# Patient Record
Sex: Male | Born: 1960 | Race: White | Hispanic: No | Marital: Married | Smoking: Former smoker
Health system: Western US, Academic
[De-identification: ages and names within clinical notes are randomized; demographics above are authoritative.]

## PROBLEM LIST (undated history)

## (undated) DIAGNOSIS — I10 Essential (primary) hypertension: Secondary | ICD-10-CM

## (undated) DIAGNOSIS — IMO0002 Reserved for concepts with insufficient information to code with codable children: Secondary | ICD-10-CM

## (undated) DIAGNOSIS — E785 Hyperlipidemia, unspecified: Secondary | ICD-10-CM

## (undated) DIAGNOSIS — K219 Gastro-esophageal reflux disease without esophagitis: Secondary | ICD-10-CM

## (undated) DIAGNOSIS — L570 Actinic keratosis: Secondary | ICD-10-CM

## (undated) DIAGNOSIS — R229 Localized swelling, mass and lump, unspecified: Secondary | ICD-10-CM

## (undated) DIAGNOSIS — M5432 Sciatica, left side: Secondary | ICD-10-CM

## (undated) HISTORY — PX: OTHER PROCEDURE: U1053

## (undated) HISTORY — DX: Reserved for concepts with insufficient information to code with codable children: IMO0002

## (undated) HISTORY — DX: Localized swelling, mass and lump, unspecified: R22.9

## (undated) HISTORY — DX: Gastro-esophageal reflux disease without esophagitis: K21.9

## (undated) HISTORY — DX: Sciatica, left side: M54.32

## (undated) HISTORY — DX: Essential (primary) hypertension: I10

## (undated) HISTORY — DX: Hyperlipidemia, unspecified: E78.5

## (undated) HISTORY — DX: Actinic keratosis: L57.0

## (undated) MED ORDER — LISINOPRIL 20 MG OR TABS
20.00 mg | ORAL_TABLET | Freq: Every day | ORAL | Status: AC
Start: 2010-11-06 — End: 2013-11-05

---

## 2006-07-03 ENCOUNTER — Ambulatory Visit (INDEPENDENT_AMBULATORY_CARE_PROVIDER_SITE_OTHER): Admitting: Family Medicine

## 2006-07-03 ENCOUNTER — Telehealth (INDEPENDENT_AMBULATORY_CARE_PROVIDER_SITE_OTHER): Payer: Self-pay | Admitting: Family Practice

## 2006-07-03 VITALS — BP 150/104 | HR 70 | Temp 97.2°F | Resp 19 | Wt 189.0 lb

## 2006-07-03 MED ORDER — KEFLEX 500 MG OR CAPS
ORAL_CAPSULE | ORAL | Status: AC
Start: 2006-07-03 — End: 2006-07-10

## 2006-07-03 MED ORDER — TRIAMCINOLONE ACETONIDE 0.1 % EX CREA
TOPICAL_CREAM | CUTANEOUS | Status: DC
Start: 2006-07-03 — End: 2006-09-30

## 2006-07-03 NOTE — Telephone Encounter (Signed)
PT SCHEDULED TODAY

## 2006-07-03 NOTE — Patient Instructions (Signed)
Skin or Soft Tissue Abscess  What is a skin or soft tissue abscess?   An abscess is a pocket of infected fluid with a thick wall around it. Skin or soft tissue abscesses occur when bacteria get into tissue below the outer layer of skin. Often abscesses occur when hair follicles or sweat glands get infected or after minor scrapes or puncture wounds.   How does it occur?   Most of the time an abscess forms when bacteria enter a break in the skin. For example, an abscess might develop when a hair becomes ingrown or when the skin is scratched or poked with something sharp. As more bacteria grow (multiply) in the skin or soft tissues, the body responds by forming a wall around the area to keep the bacteria from spreading. The bacteria in this pocket continue to multiply and as they do, the pocket becomes more swollen. Some of the bacteria may get through the wall and cause an infection of the tissues around the abscess or even get into the blood and infect other parts of the body.   What are the symptoms?   The symptoms of an abscess in the skin or soft tissues are:    swelling   redness   pain.   If the skin around the abscess has become infected, the redness may spread toward the center of the body and you may have a fever, body aches, and tiredness.   How is it diagnosed?   Your healthcare provider will ask about your symptoms and examine the area that is red and swollen.   How can I take care of myself?    Warm compresses to area for 15 minutes four times a day  Keflex 500 mg four times a day  If the redness increases, call the office.   Take Tylenol or motrin for pain   How can I help prevent a skin or soft tissue abscess?    Clean your skin as soon as possible after you are scratched or poked.   If you think you may be developing an abscess, see your healthcare provider as soon as possible.

## 2006-07-03 NOTE — Telephone Encounter (Addendum)
Npt c/o a red lump on his neck that appears to be inflamed x 3 days.

## 2006-07-06 ENCOUNTER — Telehealth (INDEPENDENT_AMBULATORY_CARE_PROVIDER_SITE_OTHER): Payer: Self-pay | Admitting: Family Medicine

## 2006-07-06 NOTE — Progress Notes (Signed)
SUBJECTIVE:  Derrick Werner is a 46 year old male with chief complaint  1.red bump on back of neck x 4 days  States has a small pea sized "knot" on back of neck for months.  Over the past few days, "knot" has turned red, grown to size of walnut., became tender and itchy.  Denies exposure to new soap,detergent, pets, lotions, travel, plants, medications  Review of Systems -   Constitutional: negative for: fatigue, night sweats, weight loss, malaise, fever.  Ears, Nose, Mouth, Throat: negative for:  sore throat, hoarseness, nasal congestion, rhinorrhea, ear pain.  CV: negative for:  palpitations, syncope, chest pain.  Resp: negative for:  cough, sputum, shortness of breath, wheezing.  Integumentary: itching, lumps or bumps.  OBJECTIVE:  Blood pressure 150/104, pulse 70, temperature 97.2 F (36.2 C), temperature source Oral, resp. rate 19, weight 189 lb (85.73 kg).  MVH:QION developed and well nourished and non-toxic  HEENT:no injection or icterus  Neck: supple and posterior cervical nodes.  Raised, red, tender, inderated,warm walnut sized mass on posterior neck  Neuro: mental status, speech normal, alert and oriented x iii  ASSESSMENT/PLAN:  1. Posterior neck abcess vs inflammed sebacecous cyst  As lesion is not fluctuant, would not I/D at this time.  NSAIDs/Tylenol for pain.  TAC cream for itching- See orders   Will treat with keflex x 14 days. If becomes fluctant and increased in size , return for I/D with probable packing  Advised ED/return if increase size, redness expanding, fever, increased pain, concerns  Advised once inflammation resolves, can follow up to have "knot" removed  Above plan was discussed with pt/wife-all questions answered and agree with above.

## 2006-07-07 ENCOUNTER — Telehealth (INDEPENDENT_AMBULATORY_CARE_PROVIDER_SITE_OTHER): Payer: Self-pay | Admitting: Family Medicine

## 2006-07-07 NOTE — Telephone Encounter (Signed)
L/M FOR PT INFORMING WHAT TO DO AFTER TAKING COURSE OF ANTIBIOTICS.

## 2006-07-08 ENCOUNTER — Encounter (INDEPENDENT_AMBULATORY_CARE_PROVIDER_SITE_OTHER)

## 2006-07-08 ENCOUNTER — Ambulatory Visit (INDEPENDENT_AMBULATORY_CARE_PROVIDER_SITE_OTHER): Admitting: Family Medicine

## 2006-07-08 NOTE — Progress Notes (Addendum)
SUBJECTIVE:  Derrick Werner is a 46 year old male with chief complaint   1.wound check  States taking antibiotics and using warm compresses.  States having increased pain. States feels mass is harder. Denies increased size, redness.  OBJECTIVE:  DGL:OVFI developed and well nourished and non-toxic  Neck: raised, erythematous,  with multiple surface pustules walnut sized mass.  Able to express yellow/white drainage with light pressure  ASSESSMENT/PLAN:  1.neck abcess- stable  Complete antibiotics.  Continue warm compresses, Tylenol and NSAIDs.  Return if increased redness, fever, pain, concerns  Plan for re-eval for possible cyst removal in 2-3 weeks-must be completely healed

## 2006-07-29 ENCOUNTER — Ambulatory Visit (INDEPENDENT_AMBULATORY_CARE_PROVIDER_SITE_OTHER): Admitting: Family Medicine

## 2006-07-29 VITALS — BP 125/80 | HR 61 | Temp 97.5°F | Wt 186.9 lb

## 2006-07-29 NOTE — Progress Notes (Signed)
SUBJECTIVE:  This is a 46 year old male presents for lesion removal. This lesion has been   present for for several months.  It is recurrent.  Just finished antibiotics for acute flare up..    OBJECTIVE:  1 cm indurated inflamed esion in the lateral posterior neck    ASSESSMENT:  Recurrent epidermal inclusion cyst    PLAN:  After informed consent was obtained, using Betadine for cleansing and 1% Lidocaine with epinephrine for anesthetic, with sterile technique elliptical excision in total was performed.  The wound is   sutured with 5 Ethilon 3/0 mattress sutures aftter clsoure of the dead space with 4/0 Vicryl.  Antibiotic dressing is applied, and wound care instructions provided.  Be alert for any signs of cutaneous infection.  The specimen is labelled and sent to pathology for evaluation.  The procedure was well tolerated without complications.

## 2006-08-12 ENCOUNTER — Ambulatory Visit (INDEPENDENT_AMBULATORY_CARE_PROVIDER_SITE_OTHER)

## 2006-08-12 NOTE — Progress Notes (Signed)
SUBJECTIVE:   Derrick Werner is a 46 year old male with chief complaint  1. Suture removal  States had cyst removed 2 weeks ago. Denies fever, pain, bleeding, drainage.  OBJECTIVE:  Neck: incision C/D/I  ASSESSMENT/PLAN:  1. status post epidermal cyst removal- healing well  Reviewd s/sx of infection  Return prn

## 2006-08-12 NOTE — Progress Notes (Signed)
Pt here for nurse visit: suture removal left neck.  Sutures evaluated by Dr. Normajean Glasgow, ok to remove.  5 sutures removed and steri-strips placed. Pt tolerated well. Pt given precautions to watch for infection.

## 2006-08-19 ENCOUNTER — Ambulatory Visit (INDEPENDENT_AMBULATORY_CARE_PROVIDER_SITE_OTHER): Admitting: Family Medicine

## 2006-08-19 VITALS — BP 132/70 | HR 72 | Temp 97.0°F | Wt 184.6 lb

## 2006-08-19 NOTE — Progress Notes (Signed)
SUBJECTIVE:   Derrick Werner is a 46 year old male who presents for cyst removal. We have already discussed this procedure at a recent visit, including option of not performing surgery, technique of surgery and potential for scarring. Informed consent obtained including risk of infection and bleeding.    OBJECTIVE:   Patient appears well. Vitals are normal.  Skin: 5mm rubbery subcutaneous cyst with central punctum at the lateral neck, inferoposterior to right ear    ASSESSMENT:   sebaceous cyst    PLAN:   After informed consent was obtained, using Betadine for cleansing and 2cc 1% Lidocaine with epinephrine for anesthetic, with sterile technique, elliptical excision in total was performed. Antibiotic dressing is applied, and wound care instructions provided.  Be alert for any signs of cutaneous infection. The procedure was well tolerated without complications. Follow up: return for suture removal in 10 days, the patient may return prn.    Dr. Judieth Keens present throughout procedure

## 2006-08-20 NOTE — Progress Notes (Signed)
Attending Note:    I was presence for the key & critical portions of the procedure and immediate availability throughout the procedure    Ludwig Lean, MD  Signature Derived From Controlled Access Password, August 20, 2006, 10:53 PM

## 2006-08-29 ENCOUNTER — Ambulatory Visit (INDEPENDENT_AMBULATORY_CARE_PROVIDER_SITE_OTHER)

## 2006-09-29 ENCOUNTER — Telehealth (INDEPENDENT_AMBULATORY_CARE_PROVIDER_SITE_OTHER): Payer: Self-pay | Admitting: Sports Medicine

## 2006-09-29 NOTE — Telephone Encounter (Signed)
APPT SCHED TOMORROW

## 2006-09-29 NOTE — Telephone Encounter (Signed)
PT C/O RASH ON BOTH LEGS.

## 2006-09-30 ENCOUNTER — Ambulatory Visit (INDEPENDENT_AMBULATORY_CARE_PROVIDER_SITE_OTHER): Admitting: Family Medicine

## 2006-09-30 VITALS — BP 136/77 | HR 74 | Temp 98.1°F | Wt 186.0 lb

## 2006-09-30 MED ORDER — DICLOXACILLIN SODIUM 500 MG OR CAPS
ORAL_CAPSULE | ORAL | Status: AC
Start: 2006-09-30 — End: 2006-10-07

## 2006-09-30 MED ORDER — ADACEL 5-2-15.5 IM SUSP
INTRAMUSCULAR | Status: AC
Start: 2006-09-30 — End: 2006-10-01

## 2006-12-20 NOTE — Progress Notes (Signed)
Progress note closed administratively without a charge as it has been greater than six months since the encounter was opened.  The note has not been reviewed or signed by the visit provider.  -Courtney Paris, MD (Physician Analyst, [place] Epic team)    This encounter was opened in error.  Please disregard.

## 2008-09-20 ENCOUNTER — Ambulatory Visit (INDEPENDENT_AMBULATORY_CARE_PROVIDER_SITE_OTHER): Admitting: Family Practice

## 2008-09-20 ENCOUNTER — Encounter (INDEPENDENT_AMBULATORY_CARE_PROVIDER_SITE_OTHER): Payer: Self-pay | Admitting: Family Practice

## 2008-09-20 ENCOUNTER — Telehealth (INDEPENDENT_AMBULATORY_CARE_PROVIDER_SITE_OTHER): Payer: Self-pay | Admitting: Family Medicine

## 2008-09-20 MED ORDER — AZITHROMYCIN 250 MG OR TABS
ORAL_TABLET | ORAL | Status: DC
Start: 2008-09-20 — End: 2008-09-20

## 2008-09-20 MED ORDER — AZITHROMYCIN 250 MG OR TABS
ORAL_TABLET | ORAL | Status: DC
Start: 2008-09-20 — End: 2009-05-08

## 2008-09-20 NOTE — Patient Instructions (Signed)
 Symptomatic therapy suggested: gargle for sore throat, use mist at bedside for congestion. Apply facial warm packs for sinus pain. May use acetaminophen, antihistamine-decongestant of choice prn.

## 2008-09-20 NOTE — Telephone Encounter (Signed)
 APPT SCHED TODAY

## 2008-09-20 NOTE — Progress Notes (Signed)
 Chief Complaint   Patient presents with   . Sinus Problem         Subjective: Lamine Laton is a 48 year old male  who presents for a new problem of nasal congestion, purulent rhinorrhea, headache and post nasal drip.    She states that since onset 8 day(s) ago, the symptoms are gradually worsening.  Associated symptoms include no other symptoms.  he has had similar symptoms in the past.states he also has loose bm in the last few days, 4-5 bm/d no blood or mucus, has only minimal abd pain ,no n/v, denies camping/travel recently.    Patient reports using no medication but for excedrine.     Respiratory history: no history of pneumonia, bronchitis, asthma, or COPD.     ROS:    Constitutional: negative for: fatigue, night sweats, weight loss, anorexia, fever.  Resp: negative for:  shortness of breath, pleuritic pain, dyspnea on exertion.      History:  I did review patient's past medical and family/social history.    Objective:    BP 135/75  Pulse 65  Temp(Src) 97.9 F (36.6 C) (Oral)  Wt 189 lb (85.73 kg)    General Appearance: healthy, alert, no distress, pleasant affect, cooperative.no dehydration.  Ears:  normal TMs and canal.  Nose:  mucosa swollen, and erythematous, tender to palpation on maxillary sinuses.  Mouth: moderate erythema.  Heart:  normal rate and regular rhythm, no murmurs, clicks, or gallops.  Lungs: clear to auscultation and percussion, no chest deformities noted.  Abd: no ttp no hsm, no rebound.    [place] was seen today for sinus problem.    Diagnoses and associated orders for this visit:    Glenford Peers (upper respiratory infection)  -sx treatment, rest, fluids, tylenol, otc decongestants and f/u    Diarrhea  - as above.    Sinusitis acute  - azithromycin (ZITHROMAX) tablet; Take  by mouth As Directed. Take 2 tablets today and the one tab a day.  -antibiotic to be used if symptoms has not improved as anticipated(prescription provided)   - Patient to call back for any new or worsening  symptoms.  -pt advised not to use asa until recovery.    Patient Instruction:  See Patient Education and Orders Sections.      Barriers to Learning assessed: none.   Patient verbalizes understanding of teaching and instructions.

## 2008-09-20 NOTE — Telephone Encounter (Signed)
 PT C/O COUGH,  GREEN PHLEGM, HEAD AND CHEST CONGESTION, SORE THROAT AND DIERRAH  X 8

## 2008-09-22 ENCOUNTER — Telehealth (INDEPENDENT_AMBULATORY_CARE_PROVIDER_SITE_OTHER): Payer: Self-pay | Admitting: Family Practice

## 2008-09-22 NOTE — Telephone Encounter (Signed)
 Pt with c/o headache and bad congestion in the nose, states he was told not to take the excedrin, instead he is using tylenol which does not help, The patient denies any symptoms of neurological impairment or TIAs; no amaurosis, diplopia, dysphasia, or unilateral disturbance of motor or sensory function. No loss of balance or vertigo. And no n/v.  Pt advised may use otc decongestants, afrin( only for 3 days) , may alternate tylenol/ibuprofen,  Patient to call back for any new or worsening symptoms.other causes of headache d/w pt.ER precaution given.  The patient indicates understanding of these issues and agrees to the plan.

## 2008-09-22 NOTE — Telephone Encounter (Signed)
 PT C/O SEVERE HA X 2 DAYS, REQ TO KNOW WHAT HE CAN TAKE ALONG WITH ANTIBIOTICS  CALL TRANSFERRED TO DR Wendall Papa

## 2009-03-07 ENCOUNTER — Ambulatory Visit (INDEPENDENT_AMBULATORY_CARE_PROVIDER_SITE_OTHER): Payer: Self-pay | Admitting: Family Practice

## 2009-03-08 ENCOUNTER — Ambulatory Visit (INDEPENDENT_AMBULATORY_CARE_PROVIDER_SITE_OTHER): Admitting: Family Practice

## 2009-03-09 ENCOUNTER — Telehealth (INDEPENDENT_AMBULATORY_CARE_PROVIDER_SITE_OTHER): Payer: Self-pay | Admitting: Family Medicine

## 2009-03-14 ENCOUNTER — Ambulatory Visit (INDEPENDENT_AMBULATORY_CARE_PROVIDER_SITE_OTHER): Payer: Self-pay | Admitting: Family Practice

## 2009-03-17 ENCOUNTER — Ambulatory Visit (INDEPENDENT_AMBULATORY_CARE_PROVIDER_SITE_OTHER): Admitting: Neuromusculoskeletal Medicine & OMM

## 2009-03-17 ENCOUNTER — Encounter (INDEPENDENT_AMBULATORY_CARE_PROVIDER_SITE_OTHER): Payer: Self-pay | Admitting: Neuromusculoskeletal Medicine & OMM

## 2009-03-17 LAB — TSH, BLOOD: TSH: 2.26 u[IU]/mL (ref 0.27–4.20)

## 2009-03-17 LAB — COMPREHENSIVE METABOLIC PANEL, BLOOD
ALT (SGPT): 21 U/L
AST (SGOT): 19 U/L
Albumin: 4.4 g/dL (ref 3.5–5.2)
Alkaline Phos: 75 U/L
BUN: 16 mg/dL (ref 6–20)
Bicarbonate: 26 mmol/L (ref 22–29)
Bilirubin, Tot: 0.3 mg/dL (ref <1.2)
Calcium: 9.7 mg/dL (ref 8.6–10.5)
Chloride: 103 mmol (ref 98–107)
Creatinine: 0.92 mg/dL (ref 0.67–1.17)
GFR (African Amer.): >60 mL/min
GFR: >60 mL/min
Glucose: 115 mg/dL (ref ?–115)
Potassium: 3.8 mmol/L (ref 3.5–5.1)
Sodium: 142 mmol/L (ref 136–145)
Total Protein: 6.8 g/dL (ref ?–8.0)

## 2009-03-17 LAB — STOOL OCCULT BLOOD POCT
Occult Blood, Control: POSITIVE
Occult Blood, Guaiac: NEGATIVE

## 2009-03-17 LAB — LIPID(CHOL FRACT) PANEL, BLOOD
Cholesterol: 216 mg/dL — ABNORMAL HIGH (ref <200)
HDL-Cholesterol: 32 mg/dL — ABNORMAL LOW
LDL-Chol (Calc): INVALID mg/dL (ref <160)
Triglycerides: 537 mg/dL — ABNORMAL HIGH (ref 10–170)

## 2009-03-17 LAB — PSA (SCREEN), BLOOD: PSA: 2.44 ng/mL (ref <4.00)

## 2009-03-17 MED ORDER — HYDROCHLOROTHIAZIDE 25 MG OR TABS
25.0000 mg | ORAL_TABLET | Freq: Every day | ORAL | Status: DC
Start: 2009-03-17 — End: 2009-04-12

## 2009-03-17 MED ORDER — IBUPROFEN 200 MG OR TABS
200.00 mg | ORAL_TABLET | Freq: Four times a day (QID) | ORAL | Status: DC | PRN
Start: ? — End: 2011-02-04

## 2009-03-17 MED ORDER — ASPIRIN-ACETAMINOPHEN-CAFFEINE 250-250-65 MG OR TABS
1.00 | ORAL_TABLET | Freq: Four times a day (QID) | ORAL | Status: DC | PRN
Start: ? — End: 2011-12-17

## 2009-03-17 MED ORDER — FLUTICASONE PROPIONATE 50 MCG/ACT NA SUSP
2.0000 | Freq: Every day | NASAL | Status: DC
Start: 2009-03-17 — End: 2011-02-04

## 2009-03-19 ENCOUNTER — Telehealth (INDEPENDENT_AMBULATORY_CARE_PROVIDER_SITE_OTHER): Payer: Self-pay | Admitting: Neuromusculoskeletal Medicine & OMM

## 2009-03-19 DIAGNOSIS — E785 Hyperlipidemia, unspecified: Secondary | ICD-10-CM

## 2009-03-19 DIAGNOSIS — I10 Essential (primary) hypertension: Secondary | ICD-10-CM

## 2009-03-31 ENCOUNTER — Encounter (INDEPENDENT_AMBULATORY_CARE_PROVIDER_SITE_OTHER)

## 2009-04-05 ENCOUNTER — Telehealth (INDEPENDENT_AMBULATORY_CARE_PROVIDER_SITE_OTHER): Payer: Self-pay | Admitting: Neuromusculoskeletal Medicine & OMM

## 2009-04-10 ENCOUNTER — Other Ambulatory Visit (INDEPENDENT_AMBULATORY_CARE_PROVIDER_SITE_OTHER)

## 2009-04-10 ENCOUNTER — Other Ambulatory Visit (INDEPENDENT_AMBULATORY_CARE_PROVIDER_SITE_OTHER): Payer: Self-pay | Admitting: Family Medicine

## 2009-04-10 ENCOUNTER — Encounter (INDEPENDENT_AMBULATORY_CARE_PROVIDER_SITE_OTHER): Payer: Self-pay

## 2009-04-10 LAB — BASIC METABOLIC PANEL, BLOOD
BUN: 16 mg/dL (ref 6–20)
Bicarbonate: 28 mmol/L (ref 22–29)
Calcium: 10.3 mg/dL (ref 8.6–10.5)
Chloride: 100 mmol/L (ref 98–107)
Creatinine: 0.96 mg/dL (ref 0.67–1.17)
Glucose: 78 mg/dL (ref 70–115)
Potassium: 4.7 mmol/L (ref 3.5–5.1)
Sodium: 140 mmol/L (ref 136–145)

## 2009-04-10 LAB — LIPID(CHOL FRACT) PANEL, BLOOD
Cholesterol: 289 mg/dL — ABNORMAL HIGH (ref <200)
HDL-Cholesterol: 43 mg/dL
LDL-Chol (Calc): 174 mg/dL — ABNORMAL HIGH (ref <160)
Triglycerides: 360 mg/dL — ABNORMAL HIGH (ref 10–170)

## 2009-04-10 LAB — GFR: GFR: >60 mL/min

## 2009-04-11 ENCOUNTER — Telehealth (INDEPENDENT_AMBULATORY_CARE_PROVIDER_SITE_OTHER): Payer: Self-pay | Admitting: Neuromusculoskeletal Medicine & OMM

## 2009-04-12 ENCOUNTER — Telehealth (INDEPENDENT_AMBULATORY_CARE_PROVIDER_SITE_OTHER): Payer: Self-pay | Admitting: Neuromusculoskeletal Medicine & OMM

## 2009-04-12 DIAGNOSIS — I10 Essential (primary) hypertension: Secondary | ICD-10-CM

## 2009-04-12 MED ORDER — HYDROCHLOROTHIAZIDE 25 MG OR TABS
25.0000 mg | ORAL_TABLET | Freq: Every day | ORAL | Status: DC
Start: 2009-04-12 — End: 2009-10-23

## 2009-04-14 NOTE — Telephone Encounter (Signed)
Routed message to Dr. Meredeth Ide. Patient would like a refill of his medication HCTZ 25 mg. Please advise patient when this has been completed.

## 2009-04-21 ENCOUNTER — Encounter (INDEPENDENT_AMBULATORY_CARE_PROVIDER_SITE_OTHER): Admitting: Neuromusculoskeletal Medicine & OMM

## 2009-04-28 ENCOUNTER — Telehealth (INDEPENDENT_AMBULATORY_CARE_PROVIDER_SITE_OTHER): Payer: Self-pay | Admitting: Family Medicine

## 2009-04-28 ENCOUNTER — Encounter (INDEPENDENT_AMBULATORY_CARE_PROVIDER_SITE_OTHER): Admitting: Neuromusculoskeletal Medicine & OMM

## 2009-04-28 ENCOUNTER — Other Ambulatory Visit (INDEPENDENT_AMBULATORY_CARE_PROVIDER_SITE_OTHER)

## 2009-04-28 LAB — LIPID(CHOL FRACT) PANEL, BLOOD
Cholesterol: 219 mg/dL — ABNORMAL HIGH (ref <200)
HDL-Cholesterol: 33 mg/dL
LDL-Chol (Calc): 143 mg/dL (ref <160)
Triglycerides: 213 mg/dL — ABNORMAL HIGH (ref 10–170)

## 2009-04-28 LAB — GFR: GFR: >60 mL/min

## 2009-04-28 LAB — BASIC METABOLIC PANEL, BLOOD
BUN: 17 mg/dL (ref 6–20)
Bicarbonate: 27 mmol/L (ref 22–29)
Calcium: 9.7 mg/dL (ref 8.6–10.5)
Chloride: 103 mmol/L (ref 98–107)
Creatinine: 0.9 mg/dL (ref 0.67–1.17)
Glucose: 83 mg/dL (ref 70–115)
Potassium: 4.1 mmol/L (ref 3.5–5.1)
Sodium: 139 mmol/L (ref 136–145)

## 2009-04-29 ENCOUNTER — Encounter (INDEPENDENT_AMBULATORY_CARE_PROVIDER_SITE_OTHER): Payer: Self-pay | Admitting: Neuromusculoskeletal Medicine & OMM

## 2009-05-08 ENCOUNTER — Ambulatory Visit (INDEPENDENT_AMBULATORY_CARE_PROVIDER_SITE_OTHER): Admitting: Neuromusculoskeletal Medicine & OMM

## 2009-05-08 VITALS — BP 133/83 | HR 68 | Temp 98.0°F | Wt 192.0 lb

## 2009-05-08 MED ORDER — FISH OIL OIL
5.00 mL | TOPICAL_OIL | Freq: Every day | Status: DC
Start: 2009-05-08 — End: 2011-02-04

## 2009-10-23 ENCOUNTER — Ambulatory Visit (INDEPENDENT_AMBULATORY_CARE_PROVIDER_SITE_OTHER): Admitting: Family Medicine

## 2009-10-23 VITALS — BP 127/87 | HR 80 | Temp 98.0°F | Resp 14 | Ht 70.0 in | Wt 187.0 lb

## 2009-10-23 MED ORDER — HYDROCHLOROTHIAZIDE 25 MG OR TABS
25.0000 mg | ORAL_TABLET | Freq: Every day | ORAL | Status: DC
Start: 2009-10-23 — End: 2010-10-31

## 2009-10-23 MED ORDER — FLUOROURACIL 5 % EX CREA
TOPICAL_CREAM | Freq: Every day | CUTANEOUS | Status: DC
Start: 2009-10-23 — End: 2009-10-23

## 2009-10-23 MED ORDER — HYDROCHLOROTHIAZIDE 25 MG OR TABS
25.0000 mg | ORAL_TABLET | Freq: Every day | ORAL | Status: DC
Start: 2009-10-23 — End: 2009-10-23

## 2009-10-23 MED ORDER — FLUOROURACIL 5 % EX CREA
TOPICAL_CREAM | Freq: Every day | CUTANEOUS | Status: DC
Start: 2009-10-23 — End: 2011-02-04

## 2009-10-24 ENCOUNTER — Encounter (INDEPENDENT_AMBULATORY_CARE_PROVIDER_SITE_OTHER): Payer: Self-pay | Admitting: Family Medicine

## 2009-10-24 NOTE — Progress Notes (Signed)
 CC:  Cyst around the neck    SUBJECTIVE:     This is a 49 year old male who had epidermal inclusion cyst removed from hjs neck about three years ago   Presents for evaluation of another one around the and possible  removal.                                                                            He also has multiple sun damages on the sun exposed areas of his skin   Noted hyperkeratotic lesions on arms and are scaling   these lesions have been progressing all over his skin       He also wanted refill onhis HTN medications.   Has no cardiovascular complaints today    Patient Active Problem List   Diagnoses Code   . Actinic keratosis 702.0   . Hypertension 401.9AH       Reviewed all his medications    Current outpatient prescriptions ordered prior to encounter   Medication Sig Dispense Refill   . hydrochlorothiazide (HYDRODIURIL) 25 MG tablet Take 1 tablet by mouth daily.  90 tablet  5   . fish oil Take 5 mL by mouth daily.    0   . aspirin-acetaminophen-caffeine (EXCEDRIN) 250-250-65 MG per tablet Take 1 Tab by mouth every 6 hours as needed.       Marland Kitchen ibuprofen (MOTRIN IB) 200 MG tablet Take 200 mg by mouth every 6 hours as needed.       . fluticasone propionate (FLONASE) 50 MCG/ACT nasal spray Use 2 Sprays into both nostrils daily. Daryel Gerald  1 Bottle  5       Review of Systems - Twelve systems reviewed are negative      OBJECTIVE:      Pleasant male in no acute distress    BP 127/87  Pulse 80  Temp(Src) 98 F (36.7 C) (Oral)  Resp 14  Ht 5\' 10"  (1.778 m)  Wt 187 lb (84.823 kg)  BMI 26.83 kg/m2    Skin:  Around the left postero-lateral aspect of the neck there is 1 cm indurated and inflamed     Lateral arms:  +  Actinic and sun damaged skin noted    ASSESSMENT/PLAN::  1. EIC (epidermal inclusion cyst) (706.2)     2. Actinic keratosis (702.0)  fluoroURACIL (EFUDEX) 5 % cream, fluoroURACIL (EFUDEX) 5 % cream   3. Hypertension (401.9)  hydrochlorothiazide (HYDRODIURIL) 25 MG tablet, hydrochlorothiazide  (HYDRODIURIL) 25 MG tablet     Schedule excision of the cysts  Refilled medications for HTN  Educated about use of Efudex mixed with sun screen twice weekly to treat and prevent AK.

## 2009-10-31 ENCOUNTER — Ambulatory Visit (INDEPENDENT_AMBULATORY_CARE_PROVIDER_SITE_OTHER): Admitting: Family Medicine

## 2009-10-31 VITALS — BP 136/89 | HR 69 | Temp 97.2°F | Resp 14 | Ht 70.0 in | Wt 190.0 lb

## 2009-10-31 MED ORDER — HYDROCODONE-ACETAMINOPHEN 5-500 MG OR TABS
1.0000 | ORAL_TABLET | Freq: Four times a day (QID) | ORAL | Status: DC | PRN
Start: 2009-10-31 — End: 2011-02-04

## 2009-11-01 NOTE — Progress Notes (Signed)
 CC:    Recurrent cyst in the neck    SUBJECTIVE:   49 year old male presents for removal recurrent epidermal inclusion cyst of the back of the neck.    These cysts have been excised in the same area in th past    But has recurred    Patient Active Problem List   Diagnoses Code   . Actinic keratosis 702.0   . Hypertension 401.[facility]       Current outpatient prescriptions ordered prior to encounter   Medication Sig Dispense Refill   . hydrochlorothiazide (HYDRODIURIL) 25 MG tablet Take 1 tablet by mouth daily.  90 tablet  5   . fluoroURACIL (EFUDEX) 5 % cream Apply  topically daily. Apply a thin layer as directed by mixing a pea size amount with sunscreen  1 Tube  3   . fish oil Take 5 mL by mouth daily.    0   . aspirin-acetaminophen-caffeine (EXCEDRIN) 250-250-65 MG per tablet Take 1 Tab by mouth every 6 hours as needed.       Marland Kitchen ibuprofen (MOTRIN IB) 200 MG tablet Take 200 mg by mouth every 6 hours as needed.       . fluticasone propionate (FLONASE) 50 MCG/ACT nasal spray Use 2 Sprays into both nostrils daily. Daryel Gerald  1 Bottle  5         OBJECTIVE:      Pleasant gentleman   BP 136/89  Pulse 69  Temp(Src) 97.2 F (36.2 C) (Oral)  Resp 14  Ht 5\' 10"  (1.778 m)  Wt 190 lb (86.183 kg)  BMI 27.26 kg/m2    Skin:  In the posterior aspect of the neck, there is 3 cm induration without erythema   Nontender   No purulence   No punctum    ASSESSMENT:  Recurrent epidermal inclusion cyst    PLAN:    Pt identified at time out  Correct procedure    After informed consent was obtained, using Betadine for cleansing   and 1% Lidocaine  with epinephrine for anesthetic, with sterile   technique elliptical excision in total was performed going through the subcutaneous fascia and fat.  The wound is   sutured with 3/0 interrupted subcutaneous Vicryl and four 4/0 Ethilon.  Antibiotic dressing is applied, and wound   care instructions provided.  Be alert for any signs of cutaneous   infection.  The specimen is labelled and sent to  pathology for   evaluation.  The procedure was well tolerated without complications.    Vicodin was prescribed for pain

## 2009-11-10 ENCOUNTER — Encounter (INDEPENDENT_AMBULATORY_CARE_PROVIDER_SITE_OTHER)

## 2010-02-22 NOTE — Lab (Signed)
Dermatopathologist: Clenton Pare. Maren Reamer, M.D.      Report Date: 08/03/2006        Dermatopathology Laboratory  Division of Dermatology  CLIA# [id number]  Director of Clinical Labs: Gwenyth Ober, M.D., Ph.D.    BIOPSY DATE: 07/29/2006    ACCESSION NUMBER: [id number].    FACILITY: [facility], [place].    REQUESTING PHYSICIAN: Cecilio Asper, M.D.    DATE RECEIVED: 07/30/2006    SITE/CLINICAL DIAGNOSIS: Lateral posterior neck/706.2 epidermal inclusion  cyst.    CLINICAL HISTORY: Recurrent epidermal inclusion cyst of the neck, excised  after treatment with antibiotics. Very fibrotic, with excessive induration  and inflammation, partially excised.    GROSS DESCRIPTION: The specimen consists of a portion of skin measuring  1.8 x 1.0 x 1.5 cm. The specimen was serially sectioned and entirely  submitted in 1 cassette.    MICROSCOPIC DESCRIPTION: The dermis shows evidence of marked fibrotic  change with nodular aggregates of fibrous tissue. Also noted are areas of  granulation tissue and small collections of granulomatous infiltrates. No  cyst was noted in any of the sections examined.    PATHOLOGIC DIAGNOSIS: Skin, lateral posterior neck: Dermal fibrosis with  granulation tissue and foreign body reaction consistent with ruptured  epidermoid cyst.            Job Number [id number] clo            Signature Derived From Controlled Access Password  Ladon Heney C. Maren Reamer, M.D. 08/04/2006 06:10 P          DD: 08/03/2006 DT: 08/04/2006 08:22 A DocNo.: [id number]  TCO/[id number] [id number]    cc: Buzzy Han, M.D.   [employer]   [place] [address] [place] [address]

## 2010-02-22 NOTE — Lab (Signed)
 FINAL PATHOLOGIC DIAGNOSIS:  A: Skin and subcutaneous tissue, posterior neck, excision   -Epidermal inclusion cyst and scar.  SPECIMEN(S) SUBMITTED: A: subcutaneous posterior neck  CLINICAL HISTORY: ICD-9 code: 706.2. Re-excision of epidermal inclusion  cyst with scarring.  GROSS DESCRIPTION: A: The specimen (received in formalin, labeled with the  patient's name and medical record number only) consists of two pink-tan  fragments of soft tissue ranging from 1.5 x 1.5 x 1.0 cm to 2.0 x 1.0 x 1.0  cm. A representative section from each fragment is submitted in cassette  A1.  ES/PM/jb  CONFIDENTIAL HEALTH INFORMATION: Health Care information is personal and  sensitive information. If it is being faxed to you it is done so under  appropriate authorization from the patient or under circumstances that do  not require patient authorization. You, the recipient, are obligated to  maintain it in a safe, secure and confidential manner. Re-disclosure  without additional patient consent or as permitted by law is prohibited.  Unauthorized re-disclosure or failure to maintain confidentiality could  subject you to penalties described in federal and state law. If you have  received this report or facsimile in error, please notify the [facility] Department immediately and destroy the received document(s).   Material reviewed and Interpreted and  Report Electronically Signed by:  Kingsley Spittle M.D. [phone])  Attending Surgical Pathologist  11/02/09 13:23  Electronic Signature derived from a single  controlled access password

## 2010-08-31 ENCOUNTER — Ambulatory Visit (INDEPENDENT_AMBULATORY_CARE_PROVIDER_SITE_OTHER): Admitting: Family Medicine

## 2010-08-31 VITALS — BP 141/81 | HR 91 | Temp 97.3°F | Wt 191.0 lb

## 2010-08-31 MED ORDER — HYDROCODONE-ACETAMINOPHEN 5-500 MG OR TABS
1.0000 | ORAL_TABLET | Freq: Four times a day (QID) | ORAL | Status: DC | PRN
Start: 2010-08-31 — End: 2011-02-04

## 2010-08-31 NOTE — Progress Notes (Signed)
FP Attending Note    Chief Complaint   Patient presents with   . ER F/U       History of Present Illness:  Derrick Werner is a 50 year old male presenting for ED follow up for nosebleed.    Went to [facility] ED 48 hrs ago.  Nasal tampon placed.    Reports some bleeding since having this placed.  Bleed was precipitated by a cold.  Patient also not taking bp medicine regularly.    Back for the past 6 months.  Has been taking ibuprofen 600-800 tid on and off for several months.  Pain is in low back.  Radiates down both legs.  Is occ debilitating.  Denies any weakness or numbness.      htn - Since restarting BP medication, reporting numbers 130-140 sbp range.  Denies any medication problems or side-effects.    ROS:  Constitutional: negative.  Ears, Nose, Mouth, Throat: as in HPI.  CV: negative.  Resp: negative.    Past Medical History   Diagnosis Date   . Hypertension 10/24/2009   . Actinic keratosis 10/23/2009      No family history on file.   History   Substance Use Topics   . Smoking status: Never Smoker    . Smokeless tobacco: Never Used   . Alcohol Use: Not on file      Current Outpatient Prescriptions   Medication Sig   . HYDROcodone-acetaminophen (VICODIN) 5-500 MG per tablet Take 1 tablet by mouth every 6 hours as needed for Moderate Pain (Pain Score 4-6).   . hydrochlorothiazide (HYDRODIURIL) 25 MG tablet Take 1 tablet by mouth daily.   . fluoroURACIL (EFUDEX) 5 % cream Apply  topically daily. Apply a thin layer as directed by mixing a pea size amount with sunscreen   . fish oil Take 5 mL by mouth daily.   Marland Kitchen aspirin-acetaminophen-caffeine (EXCEDRIN) 250-250-65 MG per tablet Take 1 Tab by mouth every 6 hours as needed.   Marland Kitchen ibuprofen (MOTRIN IB) 200 MG tablet Take 200 mg by mouth every 6 hours as needed.   . fluticasone propionate (FLONASE) 50 MCG/ACT nasal spray Use 2 Sprays into both nostrils daily. /nostril      Allergies as of 08/31/2010   . (No Known Allergies)      OBJECTIVE:   BP 141/81  Pulse 91   Temp(Src) 97.3 F (36.3 C) (Oral)  Wt 86.637 kg (191 lb)   Physical Exam: General Appearance: healthy, alert, no distress, pleasant affect, cooperative.  Eyes:  conjunctivae and corneas clear.  Ears:  normal TMs and canal.  Nose:  R side rhinorocked in place.  Mouth: oropharynx clear.  Neck:  Neck supple. No adenopathy, thyroid symmetric, normal size.  Heart:  normal rate and regular rhythm, no murmurs, clicks, or gallops.  Lungs: clear to auscultation and percussion, no chest deformities noted.  Musculoskeletal: back:Back symmetric, no curvature. ROM normal. No CVA tenderness.     ASSESSMENT and PLAN:  1. Back pain (724.5)  X-RAY LUMBAR SPINE, CONSULT/REFERRAL TO PHYSICAL THERAPY-OUTSIDE (NON-[place]), HYDROcodone-acetaminophen (VICODIN) 5-500 MG per tablet  Likely mechanical lbp.  No red flag signs/sx.  Given duration of symptoms, will obtain xrays as above.  Advised to d/c ibuprofen and other NSAID's.  Patient amenable to starting physical therapy.  Short course of vicodin prescribed for pain.   2. Epistaxis (784.7)  Nasal tampon removed in clinic today.  Patient observed x 30 min without rebleeding.  Advised on avoiding nsaids and nasal  irritants.  Will consider ENT referral if symptoms recur.     3. Hypertension (401.9)  BP with borderline control.  Will continue current regimen for now.  Recheck at next visit.    follow up for chronic conditions in 1 month    I did review patient's past medical and family/social history.  Barriers to Learning assessed: none. Patient verbalizes understanding of teaching and instructions.    Acquanetta Belling, M.D.  Department of Family and Preventive Medicine  Pager [phone]

## 2010-09-05 ENCOUNTER — Telehealth (INDEPENDENT_AMBULATORY_CARE_PROVIDER_SITE_OTHER): Payer: Self-pay | Admitting: Family Medicine

## 2010-09-05 NOTE — Telephone Encounter (Signed)
Spoke w/ pt states has been experiencing excessive nose bleeds was seen in [place] ed 08/29/10 had rhino rocket placed which was then removed 08/31/10. Pt states still having persistent nose bleeds was told to stop taking any med's that would inc sx and to stay away from physical activities until this Monday. Pt states still experiencing nose bleeds 4-5 times a day he is able to control the bleeding within 10-15 min's but states it is making it impossible to do daily routines. Pt requesting urgent attention w/ next plan of care requesting referral to ENT. Pt w/ ppo no referral needed pt given number to [facility] to set up appt. Pt also requesting referral to be placed in system. Please notify once referral placed           Pt was informed if unable to control nose bleeds within 30 min's needs to go to ed.

## 2010-09-05 NOTE — Telephone Encounter (Signed)
Agree with urgent ENT follow up.  STAT referral placed.    Agree with ED eval for persistent bleed > 30 min.

## 2010-09-05 NOTE — Telephone Encounter (Signed)
Pt's wife calling regarding a rhino rocket that was removed Friday. Since then his nose has bled half a dozen times, and 3 times in the last 24 hours. She is requesting a call back from the nurse as soon as possible to advise.

## 2010-09-05 NOTE — Telephone Encounter (Signed)
Spoke w/ pt informed referral placed pt already scheduled for 09/06/10 pt given ed precautions again for nose bleeds lasting greater than >30 min's    Pt also wanted to let Dr. Maureen Ralphs know bp ranging 140/100 is taking Hctz pt was informed should schedule f/u w/ pcp to discuss bp. Pt agreed will c/b for appt after ent appt

## 2010-09-06 ENCOUNTER — Ambulatory Visit (HOSPITAL_BASED_OUTPATIENT_CLINIC_OR_DEPARTMENT_OTHER)

## 2010-09-06 ENCOUNTER — Encounter (HOSPITAL_BASED_OUTPATIENT_CLINIC_OR_DEPARTMENT_OTHER): Payer: Self-pay

## 2010-09-06 VITALS — BP 135/85 | HR 66 | Temp 98.3°F | Resp 16

## 2010-09-06 MED ORDER — TIMOLOL MALEATE 0.5 % OP SOLN
OPHTHALMIC | Status: DC
Start: 2010-09-06 — End: 2011-02-04

## 2010-09-06 MED ORDER — LISINOPRIL 20 MG OR TABS
20.0000 mg | ORAL_TABLET | Freq: Every day | ORAL | Status: DC
Start: 2010-09-06 — End: 2011-02-04

## 2010-09-06 NOTE — Progress Notes (Signed)
I have taken the history, reviewed the PMH and ROS and performed the appropriate ENT examination. Over 50% of the time spent has been talking with the patient. The following is a dictation, performed in the presence of the patient summarizing my impression and plans.    Laural Golden, M.D.  Professor of Surgery  [employer]  [place], [place]  Email: [email or web address]  Website: [email or web address]    Practicing and Teaching Surgical Excellence While Advancing the Field of Surgery  Pt hypertensive tp 170 /110  Rx Lisonopril 20 mgs daily with Dr Yvonna Alanis OK

## 2010-09-06 NOTE — Progress Notes (Signed)
CLINIC: [facility] ENT      REPORT TYPE: NOTE      Dictating Practitioner: Helene Shoe. Ignacia Palma, M.D.    DATE OF SERVICE:  09/06/2010    REASON FOR VISIT: NOSE BLEED          Patient:  Derrick Werner, Derrick Werner  MR#: [id number]  DOS:  09/06/2010  DOB:  28-Jun-1960  Mr. Mcgue is a 50 year old gentleman who, for the last week, has suffered  right-sided nose bleeding. He at one time had a pack and even required  hospitalization. He describes the bleeding as coming first in the back of  his nose before it comes out the front. The problem is daily. The patient  is not on aspirin, although he was on Motrin. He does not take  anticoagulant medicine.    At endoscopy, there is an obvious sore or vascular lesion on the right  posterior middle turbinate. I am certain this is where the bleeding is  coming from. It is not something I can cauterize in clinic.    So, the patient is given instructions to put a piece of cotton in his nose,  changing it 3 times daily, to stop all air flow and let this heal on its  own. I have also given him a prescription for 0.5% timolol ophthalmic to  spray in his nose 2 or 3 times a day, which will facilitate healing and  decrease the bleeding. Followup is p.r.n. But, if he has another bad bleed,  I recommend he be brought to the OR and have this cauterized under general  anesthesia.  External CCs -------- > > >  (do NOT remove @ @ signs)                        Electronically signed by:  Helene Shoe. Ignacia Palma, M.D. 09/06/2010 01:23 P          DD: 09/06/2010    DT: 09/06/2010 12:50 P   DocNo.: [id number]  [id number]                 [id number]    Referring Physician:  Ludwig Lean MD  [phone] [facility] [address]  [place] [address]    Primary Care Physician:  Ludwig Lean MD  [address]  [place], [place] [address]    cc:

## 2010-09-11 ENCOUNTER — Ambulatory Visit (HOSPITAL_BASED_OUTPATIENT_CLINIC_OR_DEPARTMENT_OTHER)

## 2010-10-12 ENCOUNTER — Encounter (INDEPENDENT_AMBULATORY_CARE_PROVIDER_SITE_OTHER): Admitting: Family Medicine

## 2010-10-31 ENCOUNTER — Other Ambulatory Visit (INDEPENDENT_AMBULATORY_CARE_PROVIDER_SITE_OTHER): Payer: Self-pay | Admitting: Family Medicine

## 2010-10-31 NOTE — Telephone Encounter (Signed)
Last visit 08/31/10 with Dr. Maureen Ralphs (pt was instructed to follow up in 1 month for hypertension)  No future visit in Epic  Last filled 10/23/09  Pharmacy verified: [id number] -- [id number] -- [place] -- [place] -- [phone] -- [phone] -- [phone]  Allergies verified: NKA

## 2010-11-01 MED ORDER — HYDROCHLOROTHIAZIDE 25 MG OR TABS
25.0000 mg | ORAL_TABLET | Freq: Every day | ORAL | Status: DC
Start: 2010-10-31 — End: 2011-02-04

## 2010-11-01 NOTE — Telephone Encounter (Signed)
please have pt follow-up

## 2010-11-01 NOTE — Telephone Encounter (Signed)
Spoke with pt, pt stated that will call back to schedule an appointment. Closing encounter.

## 2010-11-06 ENCOUNTER — Other Ambulatory Visit (HOSPITAL_BASED_OUTPATIENT_CLINIC_OR_DEPARTMENT_OTHER): Payer: Self-pay | Admitting: Otolaryngology

## 2010-11-06 NOTE — Telephone Encounter (Signed)
LOV: 09/11/10  NOV: none     To be approved for change of pharmacy and quantity: 90-day supply.

## 2010-11-07 NOTE — Telephone Encounter (Signed)
Called patient and left message, informing him that medication should be prescribed by his PCP per Dr. Fransisco Hertz.

## 2010-11-08 ENCOUNTER — Other Ambulatory Visit (INDEPENDENT_AMBULATORY_CARE_PROVIDER_SITE_OTHER): Payer: Self-pay | Admitting: Family Medicine

## 2010-11-08 NOTE — Telephone Encounter (Signed)
Pharmacy req rx to be sent to     lisinopril (PRINIVIL, ZESTRIL) 20 MG tablet   Qty 90  Refill 3

## 2010-11-09 NOTE — Telephone Encounter (Signed)
[facility] with 10 refills, pt is aware.   Phone call made to [facility] left msg regarding this.

## 2010-11-20 ENCOUNTER — Telehealth (INDEPENDENT_AMBULATORY_CARE_PROVIDER_SITE_OTHER): Payer: Self-pay | Admitting: Family Medicine

## 2010-11-20 NOTE — Telephone Encounter (Signed)
Received phone call from Erin at [facility], stated that per pt's request pt desire's to have all Rx's sent to [facility] from now on.   Phone call made to pt, left msg regarding this, need ok and clarification as to which pharmacy is pt wanting to go.

## 2010-11-22 NOTE — Telephone Encounter (Signed)
Noted, Rx filled. Closing encounter.

## 2010-11-22 NOTE — Telephone Encounter (Signed)
Pt calling back and states he is using the [facility].  [facility] would [facility].  If any question please contact pt.    Pt would like a 90 day supply called in to [facility].  hydrochlorothiazide (HYDRODIURIL) 25 MG tablet   lisinopril (PRINIVIL, ZESTRIL) 20 MG tablet

## 2011-02-04 ENCOUNTER — Encounter (INDEPENDENT_AMBULATORY_CARE_PROVIDER_SITE_OTHER): Payer: Self-pay | Admitting: Family Medicine

## 2011-02-04 ENCOUNTER — Ambulatory Visit (INDEPENDENT_AMBULATORY_CARE_PROVIDER_SITE_OTHER): Admitting: Family Medicine

## 2011-02-04 VITALS — BP 130/76 | HR 66 | Temp 97.9°F | Wt 190.0 lb

## 2011-02-04 NOTE — Progress Notes (Signed)
cc:   diarrhea   HTN  s:    reports almost 2 wks of diarrhea followed after having eaten out     stools are loose, no blood, no melena or hematemesis    no vomiting    does have occasional epigastric pain      also wanted to get off BP meds    reports that BP made him fatigued and unable to function    has stopped all BP meds at home and BP at home has been running around 124/82    we reviewed all medications - and agreed to continue holding meds    patient will monitor BP at home and bring in log next visit       also complains of white spots in tow great toe nails    HCM:  has not had screening las for some time    meds:  reviewed - none at present    sh:  exercises, no weight gain, no tobacco or etoh    ros:  14 point system reviewed is negative except for the pertinent positive above    o:    pleasant gentleman - well hydrated    BP 130/76  Pulse 66  Temp(Src) 97.9 F (36.6 C) (Oral)  Wt 86.183 kg (190 lb)  heart:  RRR without murmur  lungs:  clear  abdomen: epigastric tenderness noted, otherwise, no masses or any other abnormalities  feet:  white patches noted on distal toenail plates 3 mm without significant hyperkeratosis underneath the plate    a/p:   previously diagnosed with HTN - not taking meds   BP is under control at home off medications for several weeks according to his account   agreed to continue holding medications and will monitor BP at home and bring in log next visit   white patches in toenails could be leukonychia vs DSO  1. Hypertension (401.9)  COMPREHENSIVE METABOLIC PANEL, BLOOD, GLYCOSYLATED HGB(A1C), BLOOD, LIPID(CHOL FRACT) PANEL, BLOOD, CBC WITH ADIFF, BLOOD, COMPREHENSIVE METABOLIC PANEL, BLOOD, GLYCOSYLATED HGB(A1C), BLOOD, LIPID(CHOL FRACT) PANEL, BLOOD   2. Diarrhea (787.91)  STOOL CULTURE, STANDARD O&P, STAIN, CRYPTOSPORIDIUM/ISOSPORA, SMEAR, WBC, STOOL CULTURE   3. Leukonychia (703.8)     4. Skin nodule (782.2)      diarrhea - ?etiology -- studies ordered   epigastric pain?  GERD vs PUD   review in about 1 week

## 2011-02-15 ENCOUNTER — Telehealth (INDEPENDENT_AMBULATORY_CARE_PROVIDER_SITE_OTHER): Payer: Self-pay | Admitting: Family Medicine

## 2011-02-15 NOTE — Telephone Encounter (Signed)
Pt is schedule for procedure clinic on 02/22/11 for a bump removal under arm pit. Pt states during last appt Dr.Kabongo told him that if still with pain to schedule in procedure clinic.  Pt note

## 2011-02-18 ENCOUNTER — Encounter (INDEPENDENT_AMBULATORY_CARE_PROVIDER_SITE_OTHER): Admitting: Family Practice

## 2011-02-18 NOTE — Telephone Encounter (Signed)
WILL FORWARD TO DR Judieth Keens TO SEE IF HE WANTS TO TREAT OVER THE PHONE. PT IS REQUIRED TO PAY CO-PAY WITH EACH OFFICE VISIT. PT REFUSED APPT DUE TO REQUIRED CO-PAY.

## 2011-02-18 NOTE — Telephone Encounter (Signed)
Pt calling states bump under armpit is inflame and painful. I offered an appointment and when he found out that a co pay was going to be collected he refused app. Pt states he was told by Dr. Judieth Keens that he needed to come back for a recheck, I explained to him that every time he saw a doctor even if it's for the same issue a co pay would be collected.     Please advise

## 2011-02-18 NOTE — Telephone Encounter (Signed)
Message forwarded to Dr. Kabongo

## 2011-02-18 NOTE — Telephone Encounter (Signed)
If patient is scheduled for procedure, then we will deal with it then

## 2011-02-22 ENCOUNTER — Ambulatory Visit (INDEPENDENT_AMBULATORY_CARE_PROVIDER_SITE_OTHER): Admitting: Family Medicine

## 2011-02-22 VITALS — BP 141/85 | HR 76 | Temp 97.2°F | Wt 191.6 lb

## 2011-02-22 MED ORDER — HYDROCODONE-ACETAMINOPHEN 5-500 MG OR TABS
1.0000 | ORAL_TABLET | Freq: Four times a day (QID) | ORAL | Status: DC | PRN
Start: 2011-02-22 — End: 2011-12-17

## 2011-02-22 MED ORDER — SULFAMETHOXAZOLE-TMP DS 800-160 MG OR TABS
1.0000 | ORAL_TABLET | Freq: Two times a day (BID) | ORAL | Status: DC
Start: 2011-02-22 — End: 2011-02-25

## 2011-02-23 NOTE — Progress Notes (Signed)
cc:    excision cyst in the L axilla  s:    presents for excision of recurrent epidermal inclusion cyst in the left axilla     it has become inflamed recently    meds:  reviewed and updated  Current Outpatient Prescriptions on File Prior to Visit   Medication Sig Dispense Refill   . aspirin-acetaminophen-caffeine (EXCEDRIN) 250-250-65 MG per tablet Take 1 Tab by mouth every 6 hours as needed.       o:    looks well afebrile    BP 141/85  Pulse 76  Temp(Src) 97.2 F (36.2 C) (Oral)  Wt 86.909 kg (191 lb 9.6 oz)  skin:    1 cm inflamed edematous cyst in the anterior L axilla, still indurated and tender    no purulence    a/p:   epidermal inclusion cyst   Pt identified at time out   Correct procedure   After informed consent was obtained, using Betadine for cleansing    and 1% Lidocaine  with epinephrine for anesthetic, with sterile    technique elliptical excision in total was performed going through the subcutaneous fascia and fat.  The wound is    sutured with one mattress 4/0 Ethilon.  Antibiotic dressing is applied, and wound    care instructions provided.  Be alert for any signs of cutaneous    infection.  The procedure was well tolerated without complications.   Bactrim DS prescribed for possible infection.  Vicodin was prescribed for pain   pt prefers to have the suture removal on his own

## 2011-02-25 ENCOUNTER — Other Ambulatory Visit (INDEPENDENT_AMBULATORY_CARE_PROVIDER_SITE_OTHER): Payer: Self-pay | Admitting: Family Medicine

## 2011-02-25 MED ORDER — SULFAMETHOXAZOLE-TMP DS 800-160 MG OR TABS
1.0000 | ORAL_TABLET | Freq: Two times a day (BID) | ORAL | Status: DC
Start: 2011-02-25 — End: 2011-12-17

## 2011-02-25 NOTE — Telephone Encounter (Signed)
sulfamethoxazole-trimethoprim (BACTRIM DS) 800-160 MG per tablet [[id number]]  Order Details       Dose: 1 tablet  Route: Oral  Frequency: 2 TIMES DAILY     Dispense Quantity: 14 tablet  Refills: 0  Fills Remaining: 0               Sig: Take 1 tablet by mouth 2 times daily.             Written Date: 02/22/11  Expiration Date: --        Start Date: 02/22/11  End Date: --       LAST VISIT: 02/22/11    NEXT VISIT: none     LAST REFILL: 02/22/11    DR. Kabongo    PHARMACY: [facility]

## 2011-12-17 ENCOUNTER — Ambulatory Visit (INDEPENDENT_AMBULATORY_CARE_PROVIDER_SITE_OTHER): Payer: Managed Care, Other (non HMO) | Admitting: Family Medicine

## 2011-12-17 VITALS — BP 107/59 | HR 100 | Temp 97.0°F | Resp 16 | Ht 68.25 in | Wt 198.0 lb

## 2011-12-17 MED ORDER — ASPIRIN EC 81 MG OR TBEC (CUSTOM)
81.0000 mg | DELAYED_RELEASE_TABLET | Freq: Every day | ORAL | Status: DC
Start: 2011-12-17 — End: 2015-03-01

## 2011-12-17 MED ORDER — LISINOPRIL 10 MG OR TABS
10.0000 mg | ORAL_TABLET | Freq: Every day | ORAL | Status: DC
Start: 2011-12-17 — End: 2012-03-17

## 2011-12-17 MED ORDER — LISINOPRIL 10 MG OR TABS
10.00 mg | ORAL_TABLET | Freq: Every day | ORAL | Status: DC
Start: ? — End: 2011-12-17

## 2011-12-17 MED ORDER — HYDROCHLOROTHIAZIDE 25 MG OR TABS
25.00 mg | ORAL_TABLET | Freq: Every day | ORAL | Status: DC
Start: ? — End: 2012-04-07

## 2011-12-17 NOTE — Progress Notes (Signed)
cc:   well exam   multiple medical issues    s:  51 year old male for annual routine checkup.   has not been at the clinic last two years     current issues:  1) has been on and off BP meds - when he started feeling bad - decided to go back on current medications      checks own BP art home and is igh    2)  worried about hyperglycemia - when he checks BS       discussed at length diet    3)  for last 6 weeks c/o lack of energy level       had his own theories about why he is tired       explored different causes of his fatigue       strong Family hx of colon cancer    HCM:   due for colonoscopy and prostate check    I have fully reviewed the past medical, surgical, social and family history and updated the Histories section of EpicCare.      meds:    reviewed and updated  Current Outpatient Prescriptions on File Prior to Visit   Medication Sig Dispense Refill   . aspirin 81 MG EC tablet Take 1 tablet by mouth daily.  1000 tablet  2     No current facility-administered medications on file prior to visit.       Allergies: Review of patient's allergies indicates no known allergies.     ROS:  No TIA's or unusual headaches, no dysphagia. No prolonged   cough. No dyspnea or chest pain on exertion.  No abdominal pain,   change in bowel habits, black or bloody stools.  No urinary tract   symptoms.      o:  looks well and younger than stated age  BP 107/59  Pulse 100  Temp(Src) 97 F (36.1 C) (Oral)  Resp 16  Ht 5' 8.25" (1.734 m)  Wt 89.812 kg (198 lb)  BMI 29.87 kg/m2  HEAD AND NECK:  Ears normal.  Throat, oral cavity and tongue normal.  Neck supple. No adenopathy or masses in the neck or   supraclavicular regions.  No carotid bruits. No thyromegaly.   NEURO: Cranial nerves and fundi are normal. Neck supple. DTR's normal and symmetric.    CHEST:  Clear, good air entry, no wheezes, rhonci or rales.   HEART:  S1 and S2 normal, no murmurs, clicks, gallops or   rubs.  Regular rate and rhythm.  No edema or JVD.    ABDOMEN:  Soft without tenderness, guarding, mass or organomegaly. No CVA tenderness or inguinal adenopathy.   EXTREMITIES:  Extremities, reflexes and peripheral pulses are normal.    SKIN:  No rashes or suspicious skin lesions noted.   RECTAL EXAM: Rectal negative. Prostate palpation negative. 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 Cause of fatigue not clear at present.  1. Well adult exam (V70.0)     2. Laboratory examination ordered as part of a routine general medical examination (V72.62)  COMPREHENSIVE METABOLIC PANEL, BLOOD, LIPID(CHOL FRACT) PANEL, BLOOD, GLYCOSYLATED HGB(A1C), BLOOD, TSH, BLOOD, URIC ACID, BLOOD, PSA (SCREEN), BLOOD, CBC WITH ADIFF, BLOOD, TESTOSTERONE (MALE), BLOOD, MYCHART ACCESS CODE PROCEDURE, URINALYSIS, CHEM ONLY   3. Hypertensive  disorder (401.9)  aspirin 81 MG EC tablet, lisinopril (PRINIVIL, ZESTRIL) 10 MG tablet   4. Screen for colon cancer (V76.51)  GI ENDOSCOPY PROCEDURES SERVICE REQUEST   5. Screening for prostate cancer (V76.44)     will return in AM for fasting blood  See visit diagnoses in EpicCare.    PLAN: Per orders in EpicCare.

## 2011-12-18 ENCOUNTER — Other Ambulatory Visit (INDEPENDENT_AMBULATORY_CARE_PROVIDER_SITE_OTHER): Payer: Managed Care, Other (non HMO)

## 2011-12-18 LAB — URINALYSIS
Nitrite: NEGATIVE
pH: 6 (ref 5.0–8.0)

## 2011-12-18 LAB — ECG, COMPLETE (HC/~~LOC~~/ENCINITAS)
QRS INTERVAL/DURATION: 92 ms
VENTRICULAR RATE: 74 {beats}/min

## 2011-12-27 ENCOUNTER — Encounter (INDEPENDENT_AMBULATORY_CARE_PROVIDER_SITE_OTHER): Payer: Self-pay | Admitting: Family Medicine

## 2011-12-30 NOTE — Telephone Encounter (Signed)
From: Hermenia Fiscal  To: Buzzy Han, MD  Sent: 12/27/2011 4:02 PM PDT  Subject: 1-Non Urgent Medical Advice    Can't wait 6 weeks. Need explanation of test results, causes & care plan for preventing occurrence:  Low - TESTOSTERONE, COMPREHENSIVE METABOLIC PANEL  High - CBC WITH ADIFF, PSA (SCREEN), URIC ACID, LIPID (CHOL FRACT) PANEL, COMPREHENSIVE METABOLIC PANEL    When did the 2 episodes detected on 12/17/11 EKG occur? Magnitude of damage? Prevention?    You detected abdominal blockage when I described my symptoms of daily bloating, bad heartburn/gas & soft stool. Causes? Resolutions? Preventive care plan?

## 2012-01-11 ENCOUNTER — Encounter (INDEPENDENT_AMBULATORY_CARE_PROVIDER_SITE_OTHER): Payer: Self-pay | Admitting: Family Medicine

## 2012-01-14 NOTE — Telephone Encounter (Signed)
From: Derrick Werner  To: Buzzy Han, MD  Sent: 01/11/2012 10:00 AM PDT  Subject: 1-Non Urgent Medical Advice    I sent a message on 12/27/11 with questions regarding my test results dated 12/18/11 from my 12/17/11 visit. I have been checking for a response from you on a daily basis because of all my health concerns shared with you during my visit.    According to the information stated above in the instructions, ''Messages sent here may take up to 3 days to be read...." It has been over 2 weeks now and I do not understand why there has been no response from you.

## 2012-01-21 ENCOUNTER — Telehealth (INDEPENDENT_AMBULATORY_CARE_PROVIDER_SITE_OTHER): Payer: Self-pay | Admitting: Family Medicine

## 2012-01-21 NOTE — Telephone Encounter (Signed)
Discussed with Dr. Judieth Keens.  He will call the pt.

## 2012-01-21 NOTE — Telephone Encounter (Signed)
Patient needs to make appointment to discuss the details of his lab tests.

## 2012-01-21 NOTE — Telephone Encounter (Signed)
Alejandra from the access services called and transfer pt wife over regarding a [employer].  Pt wife states that her husband has sent two my chart regarding question on pt lab results 8/16 and 8/31.  Pt wife states they received results through the my chart but don't understand it.  Pt wife states that they are very concern due to the results show high and low levels.  Pt wife also states that during pt appt on 12/17/11 pt had a EKG done and during the test Dr.Kabongo had pointed out that pt had two glitches.  Dr.Kabongo didn't explain why.  Pt wife states since the day of the appt and receiving the results in my chart with the high's and low's, pt wife states what if pt has a heart failure condition.  Pt is also concerning to change providers.  Pt wife would like to speak with [facility] regarding this matter.  Did let pt wife know that she will give a call back within 24 hrs.

## 2012-01-21 NOTE — Telephone Encounter (Signed)
What is not stated here is the office failure to communicate to patient that I was out of the office since January 03, 2012.  Also I have been on the wards.  If the patient did not understand the the messages, he should have made another appointment with another provider on my team.  How can a wife say that I did not explain the EKG, if I was the person that ordered and explained it to him.  Patient is free to choose any other provider if he wishes in this office or make another appointment to go over the labs onemore time.

## 2012-01-28 ENCOUNTER — Encounter (INDEPENDENT_AMBULATORY_CARE_PROVIDER_SITE_OTHER): Payer: Managed Care, Other (non HMO) | Admitting: Family Medicine

## 2012-02-10 ENCOUNTER — Encounter (INDEPENDENT_AMBULATORY_CARE_PROVIDER_SITE_OTHER): Payer: Managed Care, Other (non HMO) | Admitting: Family Medicine

## 2012-02-21 ENCOUNTER — Telehealth (INDEPENDENT_AMBULATORY_CARE_PROVIDER_SITE_OTHER): Payer: Self-pay | Admitting: Family Medicine

## 2012-02-21 ENCOUNTER — Encounter (INDEPENDENT_AMBULATORY_CARE_PROVIDER_SITE_OTHER): Payer: Self-pay | Admitting: Family Medicine

## 2012-02-21 ENCOUNTER — Ambulatory Visit (INDEPENDENT_AMBULATORY_CARE_PROVIDER_SITE_OTHER): Payer: Managed Care, Other (non HMO) | Admitting: Family Medicine

## 2012-02-21 VITALS — BP 133/87 | HR 65 | Temp 99.4°F | Wt 197.0 lb

## 2012-02-21 MED ORDER — ATORVASTATIN CALCIUM 10 MG OR TABS
10.0000 mg | ORAL_TABLET | Freq: Every day | ORAL | Status: DC
Start: 2012-02-21 — End: 2012-03-24

## 2012-02-21 MED ORDER — OMEGA-3 FATTY ACIDS 1000 MG OR CAPS
1.0000 g | ORAL_CAPSULE | Freq: Two times a day (BID) | ORAL | Status: DC
Start: 2012-02-21 — End: 2012-02-21

## 2012-02-21 MED ORDER — OMEPRAZOLE 20 MG OR TBEC
20.0000 mg | DELAYED_RELEASE_TABLET | Freq: Every day | ORAL | Status: DC
Start: 2012-02-21 — End: 2012-03-17

## 2012-02-21 MED ORDER — FISH OIL 500 MG OR CAPS
1000.0000 mg | ORAL_CAPSULE | Freq: Two times a day (BID) | ORAL | Status: DC
Start: 2012-02-21 — End: 2015-03-01

## 2012-02-21 NOTE — Patient Instructions (Addendum)
My name is Kristen Loader and I was the Medical Assistant that was caring for you today. If you have any questions or concerns regarding today's visit, please feel free to give me a call at [phone]. If you receive a survey in the mail, we would appreciate your feedback.  Thank you for choosing [facility] for all your healthcare needs.  Health Maintenance: Controlling Cholesterol    What is cholesterol?   Cholesterol is a type of fat. It has both good and bad effects on the body. Your body uses cholesterol to make hormones and to build and maintain nerve cells. However, when your body has too much cholesterol, deposits of fat called plaque form inside blood vessel walls. The blood vessel walls thicken and the vessels become narrower (a condition called atherosclerosis). This change in the blood vessels reduces blood flow through the blood vessels, possibly leading to heart attacks or strokes.     Most of the cholesterol in your blood is made by your liver from the fats, carbohydrates, and proteins you eat. You also get cholesterol by eating animal products such as meat, eggs, and dairy products.     How is cholesterol measured?   When you get your cholesterol checked, your health care provider will give you a number for your total cholesterol level. You can use the chart below to see if your total cholesterol is high.       ----------------------------------------    less than 200        good     200 to 239           borderline     high 240 or above    high     ----------------------------------------           When your cholesterol is measured and found to be high, your health care provider may also check the amount of LDL (low-density lipoproteins) and HDL (high-density lipoproteins) in your blood. LDL and HDL carry cholesterol through your blood. LDLs carry a lot of cholesterol, leave behind fatty deposits on your artery walls, and contribute to heart disease. HDLs do the opposite. They  clean the artery walls and remove extra cholesterol from the body, thus lowering the risk of heart disease. LDL is called "bad" cholesterol. (You can think of "L" for "lousy" cholesterol.) HDL is called "good" cholesterol (think of "H" for "healthy" cholesterol). It is good to have low levels of LDL and high levels of HDL.     The recommended levels of LDL are shown in the following chart:      ---------------------------------------------------------------    Less than 160   For most people     Less than 130   If you have an increased risk of heart                  disease     Less than 100   If you have heart disease, diabetes,                  peripheral artery disease, abdominal aortic                  aneurysm, or symptomatic carotid artery disease     ---------------------------------------------------------------             For HDL, a level below 40 mg/dL is too low. The recommended HDL level is 45 mg/dL or higher.     How can I control  my cholesterol level?   Cholesterol levels can usually be controlled by eating right, exercising, and not smoking.     Follow these diet guidelines to help control your cholesterol:       Reduce the amount of cholesterol in your diet. The American Heart Association recommends eating less than 300 milligrams (mg) of cholesterol a day if you do not have heart disease. Eat less than 200 mg if you have heart disease.   Eat less fat. Fats should contribute no more than 30% of your daily calories. No more than 10% of the fat you eat should be saturated fat. If you have heart disease, no more than 7% of the fat should be saturated. Some kinds of fats are better than others. Polyunsaturated and monounsaturated fats are better than saturated fats. Monounsaturated fats are found in olive oil, canola oil, and avocados. Polyunsaturated fats are found in fish and some vegetable oils. Saturated fat raises your blood cholesterol because it makes it hard for the body to clear the cholesterol  away. Saturated fat is found in different amounts in almost all foods. Butter, some oils, meat, and poultry fat contain a lot of saturated fat.   Adjust the amount of calories you eat and exercise regularly to maintain a lean body weight.     To control the amount of fat and cholesterol you eat:       Check food labels for fat and cholesterol content.   Limit the amount of butter and margarine you eat.   Use sunflower, safflower, soybean, canola, corn, or olive oil rather than tropical oils such as palm or coconut.   Use salad dressings and margarine made with polyunsaturated and monounsaturated fats.   Use egg whites or egg substitutes rather than whole eggs.   Replace whole-milk dairy products with nonfat or low-fat milk, cheese, spreads, and yogurt.   Eat skinless chicken, [place], fish, and meatless entrees more often than red meat.   Choose lean cuts of meat and trim off all visible fat. Keep portion sizes moderate.   Avoid fatty desserts such as ice cream, cream-filled cakes, and cheesecakes. Choose fresh fruits, nonfat frozen yogurt, Popsicles, etc.   Reduce the amount of fried foods, vending machine food, and fast food you eat.   Limit the amount of nuts you eat, especially nuts high in saturated fat. Examples of nuts that are especially high in saturated fat are cashews, pistachios, and [place] and macadamia nuts.   Eat fruits and vegetables (especially fresh fruits and leafy vegetables), beans, and whole grains daily. The fiber in these foods helps lower cholesterol.   Look for low-fat or nonfat varieties of the foods you like to eat, or look for substitutes.     Exercise goes hand-in-hand with a healthy diet for controlling cholesterol. Exercise helps because it:       Keeps your weight down.   Decreases your total cholesterol level.   Decreases your LDL (bad cholesterol).   Increases your HDL (good cholesterol).     A good exercise program includes aerobic exercise. Aerobic exercise is any activity that  keeps your heart rate up (such as swimming, jogging, walking, and bicycling). You should get 20 to 30 minutes of aerobic exercise at least every other day.     If you haven't been exercising, ask your health care provider for an exercise prescription and start your new exercise program slowly.     Do not smoke. Smoking increases your risk of heart disease because  it lowers HDL levels.     High cholesterol may run in families. Know your family history and discuss it with your health care provider.     In summary, to control your cholesterol level:       Eat healthy.   Get regular exercise.   Don't smoke.   Check your cholesterol yearly.       Patient information: Diet and health (The Basics)Written by the doctors and editors at UpToDate     Why is it important to eat a healthy diet? -- It's important to eat a healthy diet because eating the right foods can keep you healthy now and later on in life.  Which foods are especially healthy? -- Foods that are especially healthy include:  Fruits and vegetables -- Eating fruits and vegetables can help prevent heart disease and strokes. Eating them might also help prevent certain types of cancers. Try to eat fruits and vegetables at each meal and for snacks. If you don't have fresh fruits and vegetables, you can eat frozen or canned ones instead. Doctors recommend eating 5 or more servings of fruits and vegetables each day.   Foods with fiber -- Eating foods with a lot of fiber can help prevent heart disease and strokes. If you have type 2 diabetes, it can also help control your blood sugar. Foods that have a lot of fiber include vegetables, fruits, beans, nuts, oatmeal, and some breads and cereals. You can tell how much fiber is in a food by reading the nutrition label (figure 1). Doctors recommend eating 20 to 35 grams of fiber each day.   Foods with folate (also called folic acid) -- Folate is a vitamin that is important for pregnant women and women who plan to get  pregnant. Pregnant women need to get enough folate so that their unborn baby can grow normally. Folate is found in many breakfast cereals, oranges, [place] juice, and green leafy vegetables.   Foods with calcium and vitamin D -- Babies, children, and adults need calcium and vitamin D to help keep their bones strong. Adults also need calcium and vitamin D to help prevent osteoporosis. Osteoporosis is a condition that causes bones to get thin and break more easily than usual. Different foods and drinks have calcium and vitamin D in them (figure 2). People who don't get enough calcium and vitamin D in their diet might need to take a supplement. Supplements are pills, capsules, liquids, or tablets that have nutrients in them.   What about fats? -- There are different types of fats. Some types of fats are better for your body than others. Unhealthy fats are:  Trans fats -- These are found in margarines, many fast foods, and some store-bought baked goods.   Saturated fats -- These are found in red meats, butter, and many cheeses.  Both trans fats and saturated fats can raise your cholesterol level and your chance of getting heart disease. Try to avoid eating foods with these types of fats.  Healthier fats are:  Monounsaturated fats   Polyunsaturated fats  When you cook, use oils with these healthier fats, such as olive oil and canola oil.  What about alcohol? -- People who drink a small amount of alcohol each day might have a lower chance of getting heart disease. But drinking alcohol can lead to problems. For example, it can raise a person's chances of getting liver disease and certain types of cancers.  Most doctors recommend that adult women not have more  than 1 drink a day and that adult men not have more than 2 drinks a day.  How many calories do I need each day? -- The number of calories you need each day depends on your weight, height, age, sex, and how active you are.  Your doctor or nurse can tell you how many  calories you should eat each day. If you are trying to lose weight, you should eat fewer calories each day.  What if I have questions? -- If you have questions about which foods you should or should not eat, ask your doctor or nurse. The right diet for you will depend, in part, on your health and any medical conditions you have.

## 2012-02-21 NOTE — Telephone Encounter (Signed)
Spoke with pharmacy

## 2012-02-21 NOTE — Telephone Encounter (Signed)
Pharmacy calling regarding PRILOSEC OTC 20 MG EC tablet because it is not covered by insurance, the pharmacy would like to know if they can fill omeprazole 20 MG capsule. Please advise.    [id number] -- [address] -- [place], [place] [address] -- [phone] -- [phone]

## 2012-02-21 NOTE — Telephone Encounter (Signed)
Omeprazole 20 mg is fine  Please let pharmacy know

## 2012-02-21 NOTE — Interdisciplinary (Signed)
Pre-visit chart review and huddle completed with staff and physician.    Outstanding labs, imaging and consults reviewed and identified.    Health maintanence issues identified and addressed:    Health Maintenance   Topic Date Due   . Influenza Vaccine  02/11/2012   . Imm_td/tdap=>51 Yo  09/29/2016

## 2012-02-21 NOTE — Progress Notes (Signed)
[facility] SOAP Note    Chief Complaint: lab results      Subjective:  Derrick Werner 51 year old male presents for follow up lab results and persistent abdominal bloating for the past year. Patient reports that he had substernal burning sensation and feeling of abdominal fullness after meals. At first the sensation was only associated with eating tomatoes or foods w/tomato sauce, but now he seems to have symptoms after every meal. He had tried an over the counter acid reliever in the past but it did not seem to alleviate his symptoms. Symptoms are relieved by drinking a glass of milk. No stool changes. No metallic taste in mouth. No melena or tarry stools. No nausea or vomiting. No weight loss.    ROS:  Denies fevers, chills, chest pain, shortness of breath.    No Known Allergies    Objective:  Vital Signs:  Filed Vitals:    02/21/12 0857   BP: 133/87   Pulse: 65   Temp: 99.4 F (37.4 C)   TempSrc: Oral   Weight: 89.359 kg (197 lb)         Physical Exam:  General Appearance: overweight male, alert, no distress, pleasant affect, cooperative.  Eyes:  conjunctivae and corneas clear.  Ears:  normal TMs and canal.  Nose:  Nares patent with no rhinorrhea.  Neck:  Neck supple. No adenopathy, thyroid symmetric, normal size.  Heart:  normal rate and regular rhythm, no murmurs, clicks, or gallops.  Lungs: clear to auscultation with no wheezes or rales, no chest deformities noted.  Abdomen: obese, soft, non-tender. No masses or organomegaly. Bowel sounds normal.  Extremities: no rashes or lesions on exposed skin      Assessment and Plan:  Earon was seen today for results.    Diagnoses and associated orders for this visit:    Hypertriglyceridemia:  Patient with history of elevated triglycerides, not improved over the past year with diet modifications. Given that he is starting several new medications at this visit, he would like to take over the counter fish oil to attempt to lower his triglycerides. Advised patient of  the risk of elevated TGs and cholesterol, including CAD and pancreatitis. Patient voiced understanding. Will recheck in 3-6 months.  - atorvastatin (LIPITOR) 10 MG tablet; Take 1 tablet by mouth daily.  - Omega-3 Fatty Acids (FISH OIL) 500 MG capsule; Take 2 capsules by mouth 2 times daily.    Hypercholesteremia: Elevated despite diet and exercise. LFTs within normal limits. Will initiate statin therapy.  - Omega-3 Fatty Acids (FISH OIL) 500 MG capsule; Take 2 capsules by mouth 2 times daily.  - atorvastatin (LIPITOR) 10 MG tablet; Take 1 tablet by mouth daily.    Abdominal bloating: Patient with 1 year history of abdominal bloating and burning sensation, notably after foods including tomatoes that is improved with milk. Likely GERD, although H. Pylori infection also possible.. Since patient is >50y, was a former smoker, and has just developed symptoms over the past year, he should undergo EGD for further evaluation. Patient declined EGD at this time, electing to wait until the H pylori results come back. Would consider continuing to counsel patient on need for EGD regardless of H pylori results.  - Helicobacter Pylori Antibody, Blood Yellow serum separator tube; Future  - omeprazole (PRILOSEC OTC) 20 MG EC tablet; Take 20 mg by mouth daily.  - Helicobacter Pylori Antibody, Blood Yellow serum separator tube    GERD (gastroesophageal reflux disease): see above.  PSA elevation: PSA mildly elevated. Patient with no prostate/BPH symptoms. No family history of prostate cancer. Will recheck in 1 year.     Return to clinic in 1 month for follow up GERD.    Patient seen and discussed with Dr. Sharen Hones.    Dione Plover, MD  [facility] PGY-3  [phone]

## 2012-02-22 NOTE — Progress Notes (Signed)
Attending Note:    Subjective:  I reviewed the history.  Patient interviewed and examined.  History of present illness (HPI):  Results     Review of Systems (ROS): As per  the resident's note.  Past Medical, Family, Social History:  As per  the resident's  note.    Objective:   I have examined the patient and I concur with the resident's exam.    Assessment and plan reviewed with the resident physician.  I agree with the resident's plan as documented.    See the resident's note for further details.

## 2012-02-28 ENCOUNTER — Encounter (INDEPENDENT_AMBULATORY_CARE_PROVIDER_SITE_OTHER): Payer: Self-pay | Admitting: Family Medicine

## 2012-03-04 ENCOUNTER — Encounter (INDEPENDENT_AMBULATORY_CARE_PROVIDER_SITE_OTHER): Payer: Self-pay | Admitting: Family Medicine

## 2012-03-17 ENCOUNTER — Telehealth (INDEPENDENT_AMBULATORY_CARE_PROVIDER_SITE_OTHER): Payer: Self-pay | Admitting: Family Medicine

## 2012-03-17 ENCOUNTER — Ambulatory Visit (INDEPENDENT_AMBULATORY_CARE_PROVIDER_SITE_OTHER): Payer: Managed Care, Other (non HMO) | Admitting: Family Medicine

## 2012-03-17 VITALS — BP 129/78 | HR 92 | Temp 97.5°F | Wt 198.0 lb

## 2012-03-17 MED ORDER — OMEPRAZOLE 20 MG OR CPDR
20.0000 mg | DELAYED_RELEASE_CAPSULE | Freq: Every day | ORAL | Status: DC
Start: 2012-03-17 — End: 2012-03-24

## 2012-03-17 MED ORDER — OMEPRAZOLE 20 MG OR TBEC
20.0000 mg | DELAYED_RELEASE_TABLET | Freq: Every day | ORAL | Status: DC
Start: 2012-03-17 — End: 2012-03-17

## 2012-03-17 MED ORDER — LISINOPRIL 10 MG OR TABS
10.0000 mg | ORAL_TABLET | Freq: Every day | ORAL | Status: DC
Start: 2012-03-17 — End: 2013-03-11

## 2012-03-17 NOTE — Patient Instructions (Addendum)
My name is Suzanne Rhode and I was the medical assistant that was caring for you today. If you have any questions or concerns regarding today's visit, please call me at [phone]. If you receive a survey in the mail, we would appreciate your feedback.   Thank you for choosing [facility] for all your healthcare needs.    GASTROESOPHAGEAL REFLUX OVERVIEW -- Gastroesophageal reflux, also known as acid reflux, occurs when the stomach contents reflux or back up into the esophagus and/or mouth. Reflux is a normal process that occurs in healthy infants, children, and adults. Most episodes are brief and do not cause bothersome symptoms or complications.  In contrast, people with gastroesophageal reflux disease (GERD) experience bothersome symptoms as a result of the reflux. Symptoms can include heartburn, regurgitation, vomiting, and difficulty or pain with swallowing. The reflux of stomach acid can adversely affect the vocal cords causing hoarseness or even be inhaled into the lungs (called aspiration).  This topic review discusses the symptoms, causes, diagnosis, and treatment of adults with gastroesophageal reflux disease. A discussion of gastroesophageal reflux in infants, children, and adolescents is available separately. (See "Patient information: Acid reflux (gastroesophageal reflux disease) in children and adolescents (Beyond the Basics)" and "Patient information: Acid reflux (gastroesophageal reflux) in infants (Beyond the Basics)".)  WHAT IS GASTROESOPHAGEAL REFLUX? -- When we eat, food is carried from the mouth to the stomach through the esophagus, a tube-like structure that is approximately 10 inches long and 1 inch wide in adults (figure 1). The esophagus is made of tissue and muscle layers that expand and contract to propel food to the stomach through a series of wave-like movements called peristalsis.  At the lower end of the esophagus, where it joins the stomach, there is a circular ring  of muscle called the lower esophageal sphincter (LES). After swallowing, the LES relaxes to allow food to enter the stomach and then contracts to prevent the back-up of food and acid into the esophagus.  However, sometimes the LES is weak or becomes relaxed because the stomach is distended, allowing liquids in the stomach to wash back into the esophagus. This happens occasionally in all individuals. Most of these episodes occur shortly after meals, are brief, and do not cause symptoms. Normally, acid reflux should occur only rarely during sleep.  Acid reflux -- Acid reflux becomes gastroesophageal reflux disease (GERD) when it causes bothersome symptoms or injury to the esophagus. The amount of acid reflux required to cause GERD varies.  In general, damage to the esophagus is more likely to occur when acid refluxes frequently, the reflux is very acidic, or the esophagus is unable to clear away the acid quickly. The most common symptoms associated with acid reflux are heartburn, regurgitation, chest pain, and trouble swallowing. The treatments of GERD are designed to prevent one or all of these symptoms from occurring.  Hiatus hernia -- The diaphragm is a large flat muscle at the base of the lungs that contracts and relaxes as a person breathes in and out. The esophagus passes through an opening in the diaphragm called the diaphragmatic hiatus before it joins with the stomach.  Normally, the diaphragm contracts, which improves the strength of the LES, especially during bending, coughing, or straining. If there is a weakening in the diaphragm muscle at the hiatus, the stomach may be able to partially slip through the diaphragm into the chest, forming a sliding hiatus hernia.  The presence of a hiatus hernia makes acid reflux more likely.   A hiatus hernia is more common in people over age 50. Obesity and pregnancy are also contributing factors. The exact cause is unknown but may be related to the loosening of the  tissues around the diaphragm that occurs with advancing age. There is no way to prevent a hiatus hernia.  ACID REFLUX SYMPTOMS -- People who experience heartburn at least two to three times a week may have gastroesophageal reflux disease, or GERD. The most common symptom of GERD, heartburn, is estimated to affect 10 million adults in the [place] on a daily basis. Heartburn is experienced as a burning sensation in the center of the chest, which sometimes spreads to the throat; there also may be an acid taste in the throat. Less common symptoms include:  Stomach pain (pain in the upper abdomen)   Non-burning chest pain   Difficulty swallowing (called dysphagia), or food getting stuck   Painful swallowing (called odynophagia)   Persistent laryngitis/hoarseness   Persistent sore throat   Chronic cough, new onset asthma, or asthma only at night   Regurgitation of foods/fluids; taste of acid in the throat   Sense of a lump in the throat   Worsening dental disease   Recurrent lung infections (called pneumonia)   Chronic sinusitis   Waking up with a choking sensation  When to seek help -- The following signs and symptoms may indicate a more serious problem, and should be reported to a healthcare provider immediately:  Difficulty or pain with swallowing (feeling that food gets "stuck")   Unexplained weight loss   Chest pain   Choking   Bleeding (vomiting blood or dark-colored stools)  ACID REFLUX DIAGNOSIS -- Acid reflux is usually diagnosed based upon symptoms and the response to treatment. In people who have symptoms of acid reflux but no evidence of complications, a trial of treatment with lifestyle changes and in some cases, a medication, are often recommended, without testing. Specific testing is required when the diagnosis is unclear or if there are more serious signs or symptoms as described above.  It is important to rule out potentially life threatening problems that can cause symptoms similar to those of  gastroesophageal reflux disease. This is particularly true with chest pain, since chest pain can also be a symptom of heart disease (see "Patient information: Chest pain (Beyond the Basics)"). When the symptoms are not life threatening and the diagnosis of gastroesophageal reflux disease is not clear, one or more of the following tests may be recommended.  Endoscopy -- An upper endoscopy is commonly used to evaluate the esophagus. A small, flexible tube is passed into the esophagus, stomach, and small intestine. The tube has a light source and a camera that displays magnified images. Damage to the lining of these structures can be evaluated and a small sample of tissue (biopsy) can be taken to determine the extent of tissue damage. (See "Patient information: Upper endoscopy (Beyond the Basics)".)  24-hour esophageal pH study -- A 24-hour esophageal pH study is the most direct way to measure the frequency of acid reflux, although the study is not always helpful in diagnosing gastroesophageal reflux disease or reflux-associated problems. It is usually reserved for people whose diagnosis is unclear after endoscopy or a trial of treatment. It is also useful for people who continue to have symptoms despite treatment.  The test involves inserting a thin tube through the nose and into the esophagus. The tube is left in the esophagus for 24 hours. During this time the patient keeps   a diary of symptoms. The tube is attached to a small device that measures how often stomach acid is reaching the esophagus. The data are then analyzed to determine the frequency of reflux and the relationship of reflux to symptoms.  An alternate method for measuring pH uses a device that is attached to the esophagus and broadcasts pH information to a monitor worn outside of the body. This avoids the need for a tube in the esophagus and nose. The main disadvantage is that an endoscopy procedure is required to place the device (it does not require  removal, but simply passes on its own in the stool).  Esophageal manometry -- Esophageal manometry involves swallowing a tube that measures the muscle contractions of the esophagus. This can help to determine if the lower esophageal sphincter is functioning properly. This test is usually reserved for people in whom the diagnosis is unclear after other testing or in whom surgery for reflux disease is being considered.  ACID REFLUX COMPLICATIONS -- The vast majority of patients with gastroesophageal reflux disease will not develop serious complications, particularly when reflux is adequately treated. However, a number of serious complications can arise in patients with severe gastroesophageal reflux disease.  Ulcers -- Ulcers can form in the esophagus as a result of burning from stomach acid. In some cases, bleeding occurs. You may not be aware of bleeding, but it may be detected in a stool sample to test for traces of blood that may not be visible. This test is performed by putting a small amount of stool on a chemically coated card.  Stricture -- Damage from acid can cause the esophagus to scar and narrow, causing a blockage (stricture) that can cause food or pills to get stuck in the esophagus. The narrowing is caused by scar tissue that develops as a result of ulcers that repeatedly damage and then heal in the esophagus.  Lung and throat problems -- Some people reflux acid into the throat, causing inflammation of the vocal cords, a sore throat, or a hoarse voice. The acid can be inhaled into the lungs and cause a type of pneumonia (aspiration pneumonia) or asthma symptoms. Chronic acid reflux into the lungs may eventually cause permanent lung damage, called pulmonary fibrosis or bronchiectasis.  Barrett's esophagus -- Barrett's esophagus occurs when the normal cells that line the lower esophagus (squamous cells) are replaced by a different cell type (intestinal cells). This process usually results from repeated  damage to the esophageal lining, and the most common cause is longstanding gastroesophageal reflux disease. The intestinal cells have a small risk of transforming into cancer cells.  As a result, people with Barrett's esophagus are advised to have a periodic endoscopy to monitor for early warning signs of cancer. (See "Patient information: Barrett's esophagus (Beyond the Basics)".)  Esophageal cancer -- There are two main types of esophageal cancer: adenocarcinoma and squamous cell carcinoma. A major risk factor for adenocarcinoma is Barrett's esophagus, discussed above. Squamous cell carcinoma does not appear to be related to GERD. Unfortunately, adenocarcinoma of the esophagus is on the rise in the [place] and in many other countries. However, only a small percentage of people with GERD will develop Barrett's esophagus and an even smaller percentage will develop adenocarcinoma. (See "Patient information: Barrett's esophagus (Beyond the Basics)".)  REFLUX TREATMENT -- Gastroesophageal reflux disease is treated according to its severity.  Mild symptoms -- Initial treatments for mild acid reflux include dietary changes and using non-prescription medications, including antacids or histamine antagonists.    Lifestyle changes -- Changes to the diet or lifestyle have been recommended for many years, although their effectiveness has not been extensively evaluated in well-designed clinical trials. A review of the literature concluded that weight loss and elevating the head of your bed may be helpful, but other dietary changes were not found helpful in all patients [1]. Thus, these recommendations may be helpful in some, but not all people with mild symptoms of acid reflux.  For people with mild acid reflux, these treatments can be tried before seeking medical attention. However, anyone with more serious symptoms should speak to their healthcare provider before using any treatment (see 'Acid reflux symptoms'  above).  Weight loss - Losing weight may help people who are overweight to reduce acid reflux. In addition, weight loss has a number of other health benefits, including a decreased risk of type 2 diabetes and heart disease. (See "Patient information: Weight loss treatments (Beyond the Basics)".)   Raise the head of the bed six to eight inches - Although most people only have heartburn for the two- to three-hour period after meals, some wake up at night with heartburn. People with nighttime heartburn can elevate the head of their bed, which raises the head and shoulders higher than the stomach, allowing gravity to prevent acid from refluxing.    Raising the head of the bed can be done with blocks of wood under the legs of the bed or a foam wedge under the mattress. Several manufacturers have developed commercial products for this purpose. However, it is not helpful to use additional pillows; this can cause an unnatural bend in the body that increases pressure on the stomach, worsening acid reflux.   Avoid acid reflux inducing foods - Some foods also cause relaxation of the lower esophageal sphincter, promoting acid reflux. Excessive caffeine, chocolate, alcohol, peppermint, and fatty foods may cause bothersome acid reflux in some people.   Quit smoking - Saliva helps to neutralize refluxed acid, and smoking reduces the amount of saliva in the mouth and throat. Smoking also lowers the pressure in the lower esophageal sphincter and provokes coughing, causing frequent episodes of acid reflux in the esophagus. Quitting smoking can reduce or eliminate symptoms of mild reflux. (See "Patient information: Quitting smoking (Beyond the Basics)".)   Avoid large and late meals - Lying down with a full stomach may increase the risk of acid reflux. By eating three or more hours before bedtime, reflux may be reduced. In addition, eating smaller meals may prevent the stomach from becoming overdistended, which can cause acid reflux.    Avoid tight fitting clothing - At a minimum, tight fitting clothing can increase discomfort, but it may also increase pressure in the abdomen, forcing stomach contents into the esophagus.   Chew gum or use oral lozenges - Chewing gum or using lozenges can increase saliva production, which may help to clear stomach acid that has entered the esophagus.  Antacids -- Antacids are commonly used for short-term relief of acid reflux. However, the stomach acid is only neutralized very briefly after each dose, so they are not very effective. Examples of antacids include Tums®, Maalox®, and Mylanta®.  Histamine antagonists -- The histamine antagonists reduce production of acid in the stomach. However, they are somewhat less effective than proton pump inhibitors (see 'Proton pump inhibitors' below).  Examples of histamine antagonists available in the [place] include ranitidine (Zantac®), famotidine (Pepcid®), cimetidine (Tagamet®), and nizatidine (Axid®). These medications are usually taken by mouth once or twice per day.   Cimetidine, ranitidine, and famotidine are available in prescription and non-prescription strengths.  Moderate to severe symptoms -- Patients with moderate to severe symptoms of acid reflux, complications of gastroesophageal reflux disease, or mild acid reflux symptoms that have not responded to the lifestyle modifications and the medications described above usually require treatment with prescription medications. Most patients are treated with a proton pump inhibitor.  Proton pump inhibitors -- PPIs include omeprazole (Prilosec®), esomeprazole (Nexium®), lansoprazole (Prevacid®), dexlansoprazole (Kapidex™), pantoprazole (Protonix®), and rabeprazole (AcipHex®), which are stronger and more effective than the H2 antagonists.  Once the optimal dose and type of PPI is found, you will probably be kept on the PPI for approximately eight weeks. Depending upon your symptoms after eight weeks, the medication  dose may be decreased or discontinued. If symptoms return within three months, long-term treatment is usually recommended. If symptoms do not return within three months, treatment may be needed only intermittently. The goal of treatment for GERD is to take the lowest possible dose of medication that controls symptoms and prevents complications.  Proton pump inhibitors are safe, although they may be expensive, especially if taken for a long period of time. Long-term risks of PPIs may include an increased risk of gut infections, such as Clostridium (C. diff), or reduced absorption of minerals and nutrients. In general, these risks are small. However, even a small risk emphasizes the need to take the lowest possible dose for the shortest possible time. (See "Patient information: Antibiotic-associated diarrhea caused by Clostridium difficile (Beyond the Basics)".)  If symptoms are not controlled -- If your symptoms of gastroesophageal reflux disease are not adequately controlled with one PPI, one or more of the following may be recommended:  An alternate PPI may be prescribed or the dose of the PPI may be increased   The PPI may be given twice per day instead of once   Further testing may be recommended to confirm the diagnosis and/or determine if another problem is causing symptoms   Surgical treatment may be considered  Surgical treatment -- Prior to the development of the potent acid-reducing medications described above, surgery was used for severe cases of GERD that did not resolve with medical treatment. Because of the effectiveness of medical therapy, the role of surgery has become more complex. In general, anti-reflux surgery involves repairing the hiatus hernia and strengthening the lower esophageal sphincter.  The most common surgical treatment is the laparoscopic Nissen fundoplication. This procedure involves wrapping the upper part of the stomach around the lower end of the esophagus (figure 2).  Although the  outcome of surgery is usually good, complications can occur. Examples include persistent difficulty swallowing (occurring in about 5 percent of patients), a sense of bloating and gas (known as "gas-bloat syndrome"), breakdown of the repair (1 to 2 percent of patients per year), or diarrhea due to inadvertent injury to the nerves leading to the stomach and intestines.

## 2012-03-17 NOTE — Telephone Encounter (Signed)
needing medication of Omeprazole changed. Script states otc. Need to be clarified. Pt insurance does not cover if says otc

## 2012-03-17 NOTE — Progress Notes (Signed)
[facility] SOAP Note    Chief Complaint: follow up indigestion      Subjective:  #GERD: Derrick Werner 51 year old male presents for follow up abdominal pain and bloating. He was seen 02/21/12 and diagnosed with GERD and started on prilosec. He reports indigestion, bloating and burning sensation have resolved completely since starting the prilosec. He has made few diet modifications, but has still seen improvement in symptoms. No bloody stools or black tarry stools. No diarrhea, nausea, vomiting or constipation.    #Hypertension: Patient has been taking lisinopril as prescribed since last visit. Has not been taking blood pressure readings at home. No headache, vision changes, LE swelling.    No Known Allergies    Objective:  Vital Signs:  Filed Vitals:    03/17/12 1311   BP: 129/78   Pulse: 92   Temp: 97.5 F (36.4 C)   TempSrc: Oral   Weight: 89.812 kg (198 lb)       Physical Exam:  General Appearance: overweight, alert, no distress, pleasant affect, cooperative.  Mouth: moist mucus membranes, no erythema or exudate of posterior OP.  Neck:  Neck supple. No adenopathy, thyroid symmetric, normal size.  Heart:  normal rate and regular rhythm, no murmurs, clicks, or gallops.  Lungs: clear to auscultation with no wheezes or rales, no chest deformities noted.  Abdomen: Abdomen soft, non-tender. No masses or organomegaly. Bowel sounds normal.  Extremities: no clubbing, cyanosis or edema      Assessment and Plan:  Bocephus was seen today for refill request.    Diagnoses and associated orders for this visit:    GERD (gastroesophageal reflux disease): 51 year old male with 2 month history of symptoms consistent with GERD, now with symptoms completely resolved following initiation of prilosec therapy. H. Pylori negative. However, given age of onset after age 62, will refer to GI for EGD.   - GI Endoscopy Procedure Service Request    Hypertensive disorder: Currently well controlled on lisinopril. Will refill  today.  - lisinopril (PRINIVIL, ZESTRIL) 10 MG tablet; Take 1 tablet by mouth daily.    Abdominal bloating: See above.  - omeprazole (PRILOSEC OTC) 20 MG EC tablet; Take 20 mg by mouth daily.    Screening for colon cancer: Patient due for colon cancer screening. Advised him to make appointment for same day as EGD.   - GI Endoscopy Procedure Service Request    Return to clinic in 3 months for follow up endoscopy/colonoscopy results.    Patient discussed with Dr. Colen Darling.    Dione Plover, MD  [facility] PGY-3  [phone]

## 2012-03-17 NOTE — Interdisciplinary (Signed)
Pre-visit chart review and huddle completed with staff and physician.    Outstanding labs, imaging and consults reviewed and identified.    Health maintanence issues identified and addressed:    Health Maintenance   Topic Date Due    Influenza Vaccine  02/10/2013    Imm_td/tdap=>51 Yo  09/29/2016

## 2012-03-17 NOTE — Telephone Encounter (Signed)
Prescription changed and sent to pharmacy.

## 2012-03-19 NOTE — Progress Notes (Signed)
Attending Note:                                                                   Subjective:  I discussed the case with the resident at the time of the clinical session.    I reviewed the entire history.  History of present illness (HPI): Refill Request              Objective:  I reviewed the resident's examination findings.  The test results were reviewed with the resident.    Assessment and plan reviewed, discussed and finalized with the resident.   I agree with the resident's plan as documented.    See the resident physician's note for further details.

## 2012-03-24 ENCOUNTER — Telehealth (INDEPENDENT_AMBULATORY_CARE_PROVIDER_SITE_OTHER): Payer: Self-pay | Admitting: Family Medicine

## 2012-03-24 MED ORDER — ATORVASTATIN CALCIUM 10 MG OR TABS
10.0000 mg | ORAL_TABLET | Freq: Every day | ORAL | Status: DC
Start: 2012-03-24 — End: 2012-04-26

## 2012-03-24 MED ORDER — OMEPRAZOLE 20 MG OR CPDR
20.0000 mg | DELAYED_RELEASE_CAPSULE | Freq: Every day | ORAL | Status: DC
Start: 2012-03-24 — End: 2012-09-03

## 2012-03-24 NOTE — Telephone Encounter (Signed)
done!  let patient know

## 2012-03-24 NOTE — Telephone Encounter (Signed)
Pt requesting refill on     atorvastatin (LIPITOR) 10 MG tablet Sig: Take 1 tablet by mouth daily. Pt is requesting 90 day supply.  *Pt only has one day supply left and is requesting refill asap.     omeprazole (PRILOSEC) 20 MG capsule Pt is requesting 90 day supply (Pt only received 30 day supply).    [id number] -- [address] -- [place], [place] [address] -- [phone] -- [phone]

## 2012-04-06 ENCOUNTER — Telehealth (HOSPITAL_BASED_OUTPATIENT_CLINIC_OR_DEPARTMENT_OTHER): Payer: Self-pay

## 2012-04-06 NOTE — Telephone Encounter (Signed)
Left message for patient to schedule procedure.

## 2012-04-07 ENCOUNTER — Other Ambulatory Visit (INDEPENDENT_AMBULATORY_CARE_PROVIDER_SITE_OTHER): Payer: Self-pay | Admitting: Family Medicine

## 2012-04-07 MED ORDER — HYDROCHLOROTHIAZIDE 25 MG OR TABS
25.0000 mg | ORAL_TABLET | Freq: Every day | ORAL | Status: DC
Start: 2012-04-07 — End: 2013-03-03

## 2012-04-07 NOTE — Telephone Encounter (Signed)
Patient called requesting a rx refill for the medication hydrochlorothiazide (HYDRODIURIL) 25 MG tablet. Patient is requesting the rx refilled asap as he will be running out shortly and will be out of town later this week. Patient was reminded that Rx refills taked 48-72 hours to be processed. Please send rx to [facility].    [id number]   [id number]   [place], [place] [address]   [phone]

## 2012-04-22 ENCOUNTER — Telehealth (INDEPENDENT_AMBULATORY_CARE_PROVIDER_SITE_OTHER): Payer: Self-pay | Admitting: Family Medicine

## 2012-04-22 NOTE — Telephone Encounter (Signed)
Pt req new rx. Currently using atorvastatin (LIPITOR) 10 MG tablet, but would like to switch to Zocor. Pt states that lipitor is not readily available through his pharmacy, and insurance company will not pay for a 90 day supply, which he prefers.     Pt would also like omeprazole (PRILOSEC) 20 MG capsule to be written as an OTC, so he doesn't have to pay the taxes on it.     [id number] -- [address] -- [place], [place] [address] -- [phone] -- [phone]    Please advise.

## 2012-04-23 NOTE — Telephone Encounter (Signed)
Fax request regarding change of medication received. WIll place in your inbox for review.

## 2012-04-24 NOTE — Telephone Encounter (Signed)
PT IS CALLING AGAIN, REQ STATUS OF RX CHANGE.

## 2012-04-26 MED ORDER — SIMVASTATIN 20 MG OR TABS
20.0000 mg | ORAL_TABLET | Freq: Every evening | ORAL | Status: DC
Start: 2012-04-26 — End: 2013-06-21

## 2012-04-26 MED ORDER — OMEPRAZOLE 20 MG OR TBEC
20.0000 mg | DELAYED_RELEASE_TABLET | Freq: Every day | ORAL | Status: DC
Start: 2012-04-26 — End: 2013-08-06

## 2012-04-26 NOTE — Telephone Encounter (Signed)
[place], [place]  Advanced Heart Failure and Transplant  Heart Transplant Clinic  Follow-up Visit    Primary Care Physician: Brodsky, Mark E  Referring Provider: Brett Justin Berman  Date of Transplant: 07/02/2019  Organ(s) Transplanted: heart  Indication for transplant: Dilated Myopathy: Idiopathic  PHS increased risk donor: Yes    ID. 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    Interval History:    The patient was last seen on 08/27/21. At that time issues with pain after urologic procedure.    He continues to deal with pain issue largely from prostate surgery. He still has some bleeding and some tissue come out. He tried different strategies and nothing helped. This is really impacting quality of life. He gets tired and frustrated and does not want to take it out on his family.    ROS:  A complete ROS was performed and is negative except as documented in the HPI.      Allergies:  Patient is allergic to cats [other] and dogs [other].    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    Pericardial effusion    Hypertension    Chronic back pain    At risk for infection transmitted from donor    Acute hepatitis C virus infection    Heart transplanted (CMS-HCC)    Acute UTI    Umbilical hernia without obstruction and without gangrene    COVID-19 virus detected    Acute medial meniscus tear of left knee, sequela    Localized osteoarthritis of left knee     Past Surgical History:   Procedure Laterality Date    CARDIAC DEFIBRILLATOR PLACEMENT       PB ANESTH,SHOULDER JOINT,NOS Right      Family History   Problem Relation Name Age of Onset    Hypertension Other      Other Maternal Grandmother          kidney disease needing HD     Social History     Socioeconomic History    Marital status: Single     Spouse name: Not on file    Number of children: Not on file    Years of education: Not on file    Highest education level: Not on file   Occupational History    Not on file   Tobacco Use    Smoking status: Never    Smokeless tobacco: Never    Tobacco comments:     from friends and relatives    Substance and Sexual Activity    Alcohol use: Not Currently     Comment: Prior heavier use, but completely quit in 2016    Drug use: Yes     Comment: eats edible marijuana for pain and insomnia     Sexual activity: Not on file   Other Topics Concern    Not on file   Social History Narrative      Born in [place], also lived in [place], [place], [place], no travel, worked as a carpenter, occasional cedar, no birds, no hot tubs, worked in construction + possible asbestos exposure      Social Determinants of Health     Financial Resource Strain: Not on file   Food Insecurity: Not on file   Transportation Needs: Not on file   Physical Activity: Not on file   Stress: Not on file   Social Connections: Not on file   Intimate Partner Violence: Not on file   Housing Stability: Not on file     Current Outpatient Medications   Medication Sig    albuterol 108 (90 Base) MCG/ACT inhaler Inhale 2 puffs by mouth every 4 hours as needed for Wheezing or Shortness of Breath.    aspirin 81 MG EC tablet Take 1 tablet (81 mg) by mouth daily.    baclofen (LIORESAL) 10 MG tablet Take 2 tablets (20 mg) by mouth nightly.    Blood Glucose Monitoring Suppl (TRUE METRIX METER) w/Device KIT Use as directed    budesonide-formoterol (SYMBICORT) 160-4.5 MCG/ACT inhaler Inhale 2 puffs by mouth every 12 hours.    bumetanide (BUMEX) 1 MG tablet Take 1 tablet (1 mg) by mouth daily as needed (fluid/weight  gain). Do not take unless instructed by Transplant team.    Calcium Carb-Cholecalciferol 600-10 MG-MCG TABS Take 1 tablet by mouth 2 times daily.    Cetirizine HCl (ZERVIATE) 0.24 % SOLN Place 1 drop into both eyes 2 times daily.    clindamycin (CLEOCIN T) 1 % solution Apply 1 Application. topically 2 times daily. Apply to the red bumps on your face up to two times a day.    controlled substance agreement controlled substance agreement    diclofenac (VOLTAREN) 1 % gel Apply 2 g topically 4 times daily.    docusate sodium (COLACE) 100 MG capsule Take 1 capsule (100 mg) by mouth 2 times daily.    DULoxetine (CYMBALTA) 30 MG CR capsule Take 1 capsule (30 mg) by mouth daily.    famotidine (PEPCID) 20 MG tablet Take 1 tablet (20 mg) by mouth 2 times daily.    fluticasone propionate (FLONASE) 50 MCG/ACT nasal spray Spray 1 spray into each nostril 2 times daily.    gabapentin (NEURONTIN) 300 MG capsule Take 1 capsule (300 mg) by mouth every morning AND 1 capsule (300 mg) daily AND 2 capsules (600 mg) every evening.    hydroCHLOROthiazide (HYDRODIURIL) 25 MG tablet Take 1 tablet (25 mg) by mouth daily.    ketoconazole (NIZORAL) 2 % shampoo Use shampoo daily for dandruff    lidocaine (LIDOCAINE PAIN RELIEF) 4 % patch Apply 1 patch topically every 24 hours. Leave patch on for 12 hours, then remove for 12 hours.    lisinopril (PRINIVIL, ZESTRIL) 10 MG tablet Take 2 tablets (20 mg) by mouth daily.    magnesium oxide (MAG-OX) 400 MG tablet Take 1 tablet by mouth daily    melatonin (GNP MELATONIN MAXIMUM STRENGTH) 5 MG tablet Take 2 tablets (10 mg) by mouth at bedtime.    Multiple Vitamin (MULTIVITAMIN) TABS tablet Take 1 tablet by mouth daily.    naloxone (KLOXXADO) 8 mg/0.1 mL nasal spray Call 911! Tilt head and spray intranasally into one nostril as needed for respiratory depression. If patient does not respond or responds and then relapses, repeat using a new nasal spray every 3 minutes until emergency medical assistance  arrives.    NEEDLE, DISP, 25 G 25G X   1" MISC Use to inject testosterone    NIFEdipine (ADALAT CC) 30 MG Controlled-Release tablet Take 1 tablet (30 mg) by mouth nightly.    ondansetron (ZOFRAN) 8 MG tablet Take 1 tablet (8 mg) by mouth every 8 hours as needed for Nausea/Vomiting.    oxyCODONE (ROXICODONE) 10 MG tablet Take 1 tab every 4 hours as needed for moderate pain and 2 tabs every 4 hours as needed for severe pain. Max 10 tabs per day, 28 day supply    phenazopyridine (PYRIDIUM) 100 MG tablet Take 1 tablet (100 mg) by mouth 3 times daily.    polyethylene glycol (GLYCOLAX) 17 GM/SCOOP powder Mix 17 grams in 4-8 oz of liquide and drink by mouth daily as needed (Constipation).    pravastatin (PRAVACHOL) 40 MG tablet Take 1 tablet (40 mg) by mouth every evening.    senna (SENOKOT) 8.6 MG tablet Take 1 tablet (8.6 mg) by mouth daily.    sirolimus (RAPAMUNE) 1 MG tablet Take 2 tablets (2 mg) by mouth every morning.    SYRINGE-NEEDLE, DISP, 3 ML (B-D 3CC LUER-LOK SYR 25GX1") 25G X 1" 3 ML MISC Use as directed to inject testosterone    SYRINGE-NEEDLE, DISP, 3 ML 18G X 1-1/2" 3 ML MISC Use to draw up testosterone    tacrolimus (ENVARSUS XR) 1 MG tablet STOP TAKING since 01/23/22 - remaining on chart for dose adjustments, titratable med.    tacrolimus (ENVARSUS XR) 4 MG tablet Take 1 tablet (4 mg) by mouth every morning.    tamsulosin (FLOMAX) 0.4 MG capsule Take 1 capsule (0.4 mg) by mouth daily.    tamsulosin (FLOMAX) 0.4 MG capsule Take 1 capsule (0.4 mg) by mouth daily.    testosterone cypionate (DEPO-TESTOSTERONE) 200 MG/ML SOLN Inject 1 ml into the muscle every 14 days    traZODone (DESYREL) 50 MG tablet Take 1 tablet (50 mg) by mouth nightly.     Current Facility-Administered Medications   Medication    diphenhydrAMINE (BENADRYL) injection 50 mg    diphenhydrAMINE (BENADRYL) tablet 50 mg     Immunization History   Administered Date(s) Administered    COVID-19 (Moderna) Low Dose Red Cap >= 18 Years 06/23/2020     COVID-19 (Moderna) Red Cap >= 12 Years 07/31/2019, 08/30/2019, 12/29/2019    Hep-A/Hep-B; Twinrix, Adult 07/24/2020    Influenza Vaccine (High Dose) Quadrivalent >=65 Years 03/15/2020    Influenza Vaccine (Unspecified) 01/11/2017    Influenza Vaccine >=6 Months 03/26/2010, 03/26/2011, 05/21/2012, 02/16/2014, 01/30/2018, 02/15/2019    Pneumococcal 13 Vaccine (PREVNAR-13) 03/15/2020    Pneumococcal 23 Vaccine (PNEUMOVAX-23) 03/26/2013, 07/24/2020    Tdap 05/14/2011   Deferred Date(s) Deferred    Pneumococcal 23 Vaccine (PNEUMOVAX-23) 07/15/2019     Physical Exam:  BP 102/69 (BP Location: Right arm, BP Patient Position: Sitting, BP cuff size: Large)   Pulse 98   Temp 98.5 F (36.9 C) (Temporal)   Resp 16   Ht 5' 10" (1.778 m)   Wt 96.2 kg (212 lb)   SpO2 97%   BMI 30.42 kg/m      General Appearance: ***alert, no distress, pleasant affect, cooperative.  Heart:  JVD ***, PMI ***, normal rate and regular rhythm, no murmurs, clicks, or gallops. ***  Lungs: ***clear to auscultation and percussion. No rales, rhonchi, or wheezes noted. No chest deformities noted.  Abdomen: ***BS normal.  Abdomen soft, non-tender.  No masses or organomegaly.  Extremities:  ***no cyanosis, clubbing, or edema. Has 2+ peripheral pulses.        Lab Data:  Lab Results   Component Value Date    BUN 26 (H) 01/23/2022    CREAT 1.98 (H) 01/23/2022    CL 99 01/23/2022    NA 140 01/23/2022    K 4.4 01/23/2022    [place] 9.2 01/23/2022    TBILI 0.47 01/23/2022    ALB 4.1 01/23/2022    TP 7.1 01/23/2022    AST 22 01/23/2022    ALK 76 01/23/2022    BICARB 29 01/23/2022    ALT 25 01/23/2022    GLU 126 (H) 01/23/2022     Lab Results   Component Value Date    WBC 7.9 01/23/2022    RBC 5.50 01/23/2022    HGB 15.2 01/23/2022    HCT 46.5 01/23/2022    MCV 84.5 01/23/2022    MCHC 32.7 01/23/2022    RDW 12.3 01/23/2022    PLT 162 01/23/2022    MPV 11.6 01/23/2022     Lab Results   Component Value Date    A1C 5.7 06/22/2021     Lab Results   Component Value Date     TSH 1.63 06/22/2021     Lab Results   Component Value Date    CHOL 105 06/22/2021    HDL 38 06/22/2021    LDLCALC 43 06/22/2021    TRIG 121 06/22/2021     Lab Results   Component Value Date    SIROT 11.5 01/23/2022     Lab Results   Component Value Date    FKTR 6.5 01/23/2022     No results found for: CSATR  Lab Results   Component Value Date    CMVPL Not Detected 04/13/2021     Lab Results   Component Value Date    DSA ABSENT 01/23/2022       Prior Cardiovascular Studies:   Lab Results   Component Value Date    LV Ejection Fraction 59 07/19/2021          Echo 07/19/21  Summary:   1. The left ventricular size is normal. The left ventricular systolic function is normal.   2. No left ventricular hypertrophy.   3. Normal pattern of left ventricular diastolic filling.   4. EF=59%.   5. Compared to prior study EF now 59%, was 69% 08/11/20.     LHC/IVUS 07/17/21  CONCLUSION:                                                                   1. Myocardial bridging with mild systolic compression of the mid segment    of the left anterior descending coronary artery.                              2. No angiographic evidence of coronary artery disease.                      3. Intimal thickness noted in LAD/LM up to 0.5 mm (Stable to slightly       worse compare to 2022).                                                         4. Non significant FFR at apical LAD.                                        5. Left ventricular end diastolic pressure appears normal.        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    Assessment/Plan:  # Hematuria  # Dysuria  # Chronic pain  Assessment: We had a long frank discussion about patient's chronic pain issues and the heart transplant team's role in this. I discussed with him that when I initially agreed to cover his chronic opiate prescription, this was the assumption that he would  have a provider versed in chronic pain after 3-4 months, but we are at 6 months and has unable to find one. Additionally, I had not put him on a pain contract at that time, but he recently used more opiates without asking and I informed him this was not appropriate, but because he had not established guidelines I was not going to stop at this time. However, going forward until he can establish with a pain physician, we will set up a pain contract and he will need to follow through like a usual pain clinic with us with goal of provider in 3-4 months or I may start tapering. I will augment adjuvant agents additionally for now and we can continue to work on this.  Plan:  -pain contract signed  -urine tox monthly  -clinic follow up month  -oxycodone 10 mg tablets PO, 1 tab every 4 hours moderate pain, 2 tabs every 4 hours for severe pain, no more than 10 tablets a day, total 280 per 28 days.   -diclofenac cream for joint pain  -lidocaine patch for back pain  -trial of pyridium  -increase gaba at night  -siro change as below  -cymbalta as below    # End-stage heart failure s/p orthotopic heart transplant  # Chronic Immunosuppression/Immunomodulation  Assessment: While we thought continuing sirolimus would help prevent recurrent scar tissue from prostate procedure, it may be exacerbating factors now with delayed wound healing. Will try mmf for 1 month.  Plan:   - continue envarsus 6 mg daily, goal trough 4-8  - HOLD sirolimus 3 mg daily, goal trough 4-8 for at least 1 month  - start mmf 1000 mg bid for one month to allow healing  - Continue to monitor for renal toxicities, infection risk and malignancy risk  - continue pravastatin 40 mg daily  - continue aspirin 81 mg daily    # Hypertension  Assessment: controlled  Plan:  -continue lisinopril 20 mg daily  -resume hctz  -nifedipine 30 mg daily    # Dyslipidemia  -continue pravastatin 40 mg daily    # Depression  Assessment: improved mood  Plan:  -increase cymbalta to 120  mg daily     RTC in 1 month       Nicholas W Wettersten, MD  Advanced Heart Failure, Mechanical Circulatory Support, Transplant  Pgr: [phone]

## 2012-04-29 NOTE — Telephone Encounter (Signed)
Called and spoke to patient. Informed him of medication change.

## 2012-05-15 ENCOUNTER — Encounter (HOSPITAL_BASED_OUTPATIENT_CLINIC_OR_DEPARTMENT_OTHER): Payer: Self-pay

## 2012-05-27 ENCOUNTER — Encounter: Payer: Self-pay | Admitting: Hospital

## 2012-09-03 ENCOUNTER — Encounter (INDEPENDENT_AMBULATORY_CARE_PROVIDER_SITE_OTHER): Payer: Self-pay | Admitting: Family Medicine

## 2012-09-03 ENCOUNTER — Ambulatory Visit (INDEPENDENT_AMBULATORY_CARE_PROVIDER_SITE_OTHER): Payer: BC Managed Care – PPO | Admitting: Family Medicine

## 2012-09-03 VITALS — BP 112/68 | HR 55 | Temp 98.0°F | Resp 18 | Wt 188.0 lb

## 2012-09-03 MED ORDER — GABAPENTIN 300 MG OR CAPS
300.0000 mg | ORAL_CAPSULE | Freq: Three times a day (TID) | ORAL | Status: DC
Start: 2012-09-03 — End: 2012-09-29

## 2012-09-03 NOTE — Progress Notes (Signed)
SUBJECTIVE:  Derrick Werner is a 52 year old male with chief complaint of Pain    Patient Active Problem List   Diagnosis    Actinic keratosis    Hypertensive disorder    Hyperlipidemia    GERD (gastroesophageal reflux disease)    Sciatica    Chronic lower back pain    Paresthesia of lower extremity    Ganglion cyst of wrist     1) Acute on Chronic lower back pain x 45-60d. Chronic lower back pain since a teenager (30+ years).  Constant. Maybe caused by moving heavy building materials at home.  Pain radiating to buttocks and numbness and paresthesias from lower back to the L knee.  Denies saddle anesthesia/sciatica. Denies LE weakness. Denies bladder/bowel incontinence or retention.  Worse with riding motorcycle, lifting, certain movement.  Better with ibuprofen.  Pt has had a similar type of pain and paresthesias on and off over the years, but this time, lasting longer than usual.    2) Radial L cyst x ~53month. Uncomfortable.  No overlying skin changes. Denies redness/swelling/drainage.    3) Recurrence of L axilla cyst at site of sebaceous cyst excision; excised in 02/2011.  This area reappeared about 1 year ago. Not getting larger, but uncomfortable. No overlying skin changes. Denies redness/swelling/drainage.      PMhx, PShx, MED, ALL, Shx, Fhx reviewed and in EPIC    ROS: as mentioned above  Denies fever/chills/sweats.  Denies URI s/sx.  Denies HA/lightheaded/dizziness.  Denies CP/SOB/N//V/diaphoresis.  Denies palpitations/orthopnea  Denies abd pain/n/v/d/c.  Denies melena/hematochezia.  Denies dysuria/hematuria.  Denies rash.  Denies visual changes/facial droop/confusion.  Denies any other focal weakness/numbness/parasthesias except stated above.  Denies any other new or worsening motor-sensory.  Denies unintentional wt loss. Denies night sweats.    Future Appointments  Date Time Provider Department Center   09/03/2012 10:30 AM Delbert Phenix, MD [facility]     Current Outpatient Prescriptions  on File Prior to Visit   Medication Sig Dispense Refill    aspirin 81 MG EC tablet Take 1 tablet by mouth daily.  1000 tablet  2    hydrochlorothiazide (HYDRODIURIL) 25 MG tablet Take 1 tablet by mouth daily.  30 tablet  3    lisinopril (PRINIVIL, ZESTRIL) 10 MG tablet Take 1 tablet by mouth daily.  90 tablet  3    Omega-3 Fatty Acids (FISH OIL) 500 MG capsule Take 2 capsules by mouth 2 times daily.  120 capsule  6    omeprazole (PRILOSEC OTC) 20 MG EC tablet Take 20 mg by mouth daily.  90 tablet  0    [DISCONTINUED] omeprazole (PRILOSEC) 20 MG capsule Take 1 capsule by mouth daily.  90 capsule  3    simvastatin (ZOCOR) 20 MG tablet Take 1 tablet by mouth every evening.  90 tablet  3     No current facility-administered medications on file prior to visit.     Allergies as of 09/03/2012    (No Known Allergies)     Immunization History   Administered Date(s) Administered    Influenza Vaccine >=3 Years 03/17/2009    Tdap 09/30/2006       OBJECTIVE:  Filed Vitals:    09/03/12 1022   BP: 112/68   Pulse: 55   Temp: 98 F (36.7 C)   TempSrc: Oral   Resp: 18   Weight: 85.276 kg (188 lb)     Estimated body mass index is 28.36 kg/(m^2) as calculated from the  following:    Height as of 12/17/11: 5' 8.25" (1.734 m).    Weight as of this encounter: 85.276 kg (188 lb).  Gen: NAD, pleasant, comfortable  HEENT: NC/AT  Neck: full AROM without problem;  CV: RRR, S1+S2+, no M/R/G/C;  Resp: CTAB; no wheezes/rales; good air exchange  Abd: BS+ normoactive; soft; NT/ND; no rebound/guarding; no CVA ttp  Spine: full AROM with discomfort; no spinal tenderness; normal paraspinal muscles  Neuro: normal gait; MS +5/5 b/l; sensation grossly intact with soft and sharp touch throughout; patellar and achilles DTR 2+ b/l; negative straight leg raise bilaterally    UE: L wrist radial region with nontender, smooth cyst; No edema; no erythema/ ecchymosis/cyanosis; no skin lesions; no induration / fluctuance; no discharge; full AROM and PROM of  shoulder, elbow, wrist, fingers; cap refill <2sec; normal radial pulses 2+; sensation grossly intact    Skin: L anterior axilla with smooth, mobile, mildly ttp, nonerythematous, nonindurated, nonfluctuant cyst. No overlying skin changes.    LLE:   No edema or joint effusion; no erythema/ ecchymosis/cyanosis; no skin lesions; no induration / fluctuance; no discharge; full AROM and PROM of hip, knee, ankle and toes; discomfort of lower back with extension of knee and flexion of hip; cap refill <2sec; normal DP and PT pulses 2+; sensation grossly intact; no bony ttp;    RLE:   No edema or joint effusion; no erythema/ ecchymosis/cyanosis; no skin lesions; no induration / fluctuance; no discharge; full AROM and PROM of hip, knee, ankle and toes; cap refill <2sec; normal DP and PT pulses 2+; sensation grossly intact; no bony ttp;      ASSESSMENT/PLAN:  Derrick Werner was seen today for pain.    Diagnoses and associated orders for this visit:    LLE Sciatica  Paresthesia of L lower extremity  Acute on Chronic lower back pain: f/u after MRI; return sooner if any new/worsening/worrisome s/sx appear  - X-Ray Lumbosacral Spine 2 Or 3 Views; Future  - MRI Lumbar Spine W/O Contrast; Future  - Start gabapentin (NEURONTIN) 300 MG capsule; Take 1 capsule by mouth 3 times daily. 300 mg ORALLY on day 1, 300 mg twice a day on day 2, and 300 mg 3 times a day on day 3; then continue taking 300mg  3 times a day.    Likely Ganglion cyst of L wrist: new dx  - Refer to Orthopedics Clinic    Likely Sebaceous cyst of left axilla: s/p excision originally in 2012  - Family Medicine Clinic--procedure clinic        Spent greater than 40 min with pt with greater than 50% of time spent on counseling and face-to-face.      Patient Instruction:  See progress note.   Barriers to Learning assessed: none.   Patient verbalizes understanding of teaching and instructions.    Medication Review:  Medications reviewed with patient and medication list reconciled.  Over  the counter medications, herbal therapies and supplements reviewed.  Patient's understanding and response to medications assessed.    Barriers to medications assessed and addressed.   Risks, benefits, alternatives to medications reviewed.      Education and Case Management:  Goals of management reviewed with patient.  Barriers to achieving goals reviewed and addressed with patient.      Updated medication list:  Current Outpatient Prescriptions   Medication Sig    aspirin 81 MG EC tablet Take 1 tablet by mouth daily.    gabapentin (NEURONTIN) 300 MG capsule Take 1 capsule by  mouth 3 times daily. 300 mg ORALLY on day 1, 300 mg twice a day on day 2, and 300 mg 3 times a day on day 3; then continue taking 300mg  3 times a day.    hydrochlorothiazide (HYDRODIURIL) 25 MG tablet Take 1 tablet by mouth daily.    lisinopril (PRINIVIL, ZESTRIL) 10 MG tablet Take 1 tablet by mouth daily.    Omega-3 Fatty Acids (FISH OIL) 500 MG capsule Take 2 capsules by mouth 2 times daily.    omeprazole (PRILOSEC OTC) 20 MG EC tablet Take 20 mg by mouth daily.    [DISCONTINUED] omeprazole (PRILOSEC) 20 MG capsule Take 1 capsule by mouth daily.    simvastatin (ZOCOR) 20 MG tablet Take 1 tablet by mouth every evening.     No current facility-administered medications for this visit.

## 2012-09-03 NOTE — Interdisciplinary (Signed)
Pre-visit chart review and huddle completed with staff and physician.    Outstanding labs, imaging and consults reviewed and identified.    Health maintanence issues identified and addressed:    Health Maintenance   Topic Date Due    Influenza Vaccine  02/10/2013    Imm_td/tdap=>52 Yo  09/29/2016

## 2012-09-03 NOTE — Patient Instructions (Addendum)
My name is Lindsay Benson and I was the Medical Assistant that was caring for you today. If you have any questions or concerns regarding today's visit, please feel free to give me a call at [phone]. If you receive a survey in the mail, we would appreciate your feedback. Thank you for choosing [facility], [place] for all your healthcare needs.

## 2012-09-14 ENCOUNTER — Ambulatory Visit (INDEPENDENT_AMBULATORY_CARE_PROVIDER_SITE_OTHER): Payer: BC Managed Care – PPO | Admitting: Hand Surgery

## 2012-09-14 ENCOUNTER — Encounter (HOSPITAL_COMMUNITY): Payer: Self-pay

## 2012-09-14 ENCOUNTER — Encounter (INDEPENDENT_AMBULATORY_CARE_PROVIDER_SITE_OTHER): Payer: Self-pay | Admitting: Hand Surgery

## 2012-09-14 ENCOUNTER — Telehealth (INDEPENDENT_AMBULATORY_CARE_PROVIDER_SITE_OTHER): Payer: Self-pay | Admitting: Family Medicine

## 2012-09-14 VITALS — BP 115/77 | HR 64 | Temp 97.8°F | Ht 70.0 in | Wt 180.0 lb

## 2012-09-14 SURGERY — FUSION, SHOULDER
Anesthesia: Choice Per Patient on Day of Surgery | Laterality: Left

## 2012-09-14 NOTE — Progress Notes (Signed)
Derrick Werner presents for evaluation of his left wrist.  He has a mass that has been growing for the past 2 months.  The masses over the dorsoradial aspect of his wrist just distal to the extensor retinaculum.  Of note he had a prior saw injury to his left thumb that was ultimately successfully covered with a great toe wraparound flap approximately 20 years ago the mass is proximal to the incision for his flap.    Prior medical history is positive for the above-mentioned thumb injury  Surgical history is positive for the above-mentioned thumb reconstruction  Review of systems is positive for history of nosebleeds, heartburn, hypertension and numbness in his left lower leg.  Medications include hydrochlorothiazide, gabapentin, lisinopril and simvastatin  He has no known medical allergies  Family history is noncontributory this problem  He is retired.  He is married with 2 children.  He exercises daily.  He is not on a special diet.  He has no history of substance abuse.  He smokes occasionally.  He drinks alcohol once or twice per month.    Examination shows an approximately 1 cm by approximately 2 cm mass over the dorsoradial aspect of his left wrist.  The mass appears to be just proximal to the extensor retinaculum.  It is approximately 1 cm proximal to his thumb incision.  He has no tenderness along the first dorsal interosseous.  There is no motion with thumb EPL or first dorsal interosseous movement.  He has no sign of erythema or other surrounding symptoms.    Radiographs show no degenerative changes    Treatment plan: He has a mass over the dorsum of his left wrist.  Options discussed and the patient and he wishes to proceed with excisional biopsy.  The risks, benefits, rationale for surgery and alternatives are discussed with the patient and include but are not limited to bleeding, infection, nerve injury and the need for further surgery.  Questions were answered. Consent was confirmed in the office. He will  contact the office for surgical scheduling.

## 2012-09-15 ENCOUNTER — Encounter (INDEPENDENT_AMBULATORY_CARE_PROVIDER_SITE_OTHER): Payer: Self-pay | Admitting: Family Medicine

## 2012-09-15 ENCOUNTER — Telehealth (INDEPENDENT_AMBULATORY_CARE_PROVIDER_SITE_OTHER): Payer: Self-pay | Admitting: Family Medicine

## 2012-09-15 NOTE — Telephone Encounter (Signed)
Received the following request:  [facility] is requesting peer to peer with MD for the MRI lumbar spine that was submitted for the patient. Please call [facility] @ [phone] option #2 & refer to member ID #[id number]. Thank you   ++++++++++++++++++++++++++++++++++++++++++++++     Called and spoke with representative. Forwarding to Kristie Cowman to continue process. Thank you.  #valid for 30 days  Authorization #[id number]

## 2012-09-15 NOTE — Telephone Encounter (Signed)
Message copied by Rodena Medin on Tue Sep 15, 2012 11:07 AM  ------       Message from: Brigitte Pulse       Created: Tue Sep 15, 2012  8:12 AM       Regarding: PEER TO PEER       Contact: [phone]         [facility] is requesting peer to peer with MD for the MRI lumbar spine that was submitted for the patient. Please call [facility] @ [phone] option #2 & refer to member ID #[id number]. Thank you  ------

## 2012-09-15 NOTE — Telephone Encounter (Signed)
Received a call from the call center, regarding MRI,which is still pending. Informed staff personal that [facility], is responsible for the authorization approval of the MRI.  The Radiology department staff member, will contact the patient for scheduling.

## 2012-09-16 ENCOUNTER — Telehealth (INDEPENDENT_AMBULATORY_CARE_PROVIDER_SITE_OTHER): Payer: Self-pay | Admitting: Family Medicine

## 2012-09-16 ENCOUNTER — Ambulatory Visit (HOSPITAL_BASED_OUTPATIENT_CLINIC_OR_DEPARTMENT_OTHER): Payer: BC Managed Care – PPO

## 2012-09-16 NOTE — Telephone Encounter (Signed)
Forwarded to Patricia Jackson.

## 2012-09-16 NOTE — Telephone Encounter (Signed)
Message read, the referral has been completed with the "[facility]" the paperwork has been faxed for scheduling.     Mr Artha Voncannon, has been notified.

## 2012-09-18 NOTE — Telephone Encounter (Signed)
Forwarding to orthopedist, Dr. Sherrilee Gilles, MD, who saw pt for this on 09/14/2012.   The ortho hand clinic, I believe, will be able to help assist with this.  Green team MA's. Please inform wife. Thank you!

## 2012-09-21 ENCOUNTER — Ambulatory Visit (INDEPENDENT_AMBULATORY_CARE_PROVIDER_SITE_OTHER): Payer: BC Managed Care – PPO | Admitting: Family Medicine

## 2012-09-21 VITALS — BP 106/67 | HR 64 | Temp 97.7°F | Wt 192.0 lb

## 2012-09-21 NOTE — Interdisciplinary (Signed)
Pre-visit chart review and huddle completed with staff and physician.    Outstanding labs, imaging and consults reviewed and identified.    Health maintanence issues identified and addressed:    Health Maintenance   Topic Date Due   . Influenza Vaccine  02/10/2013   . Imm_td/tdap=>52 Yo  09/29/2016       Patient Active Problem List   Diagnosis   . Actinic keratosis   . Hypertensive disorder   . Hyperlipidemia   . GERD (gastroesophageal reflux disease)   . Sciatica   . Chronic lower back pain   . Paresthesia of lower extremity   . Ganglion cyst of wrist

## 2012-09-21 NOTE — Patient Instructions (Signed)
My name is Betzy Patrick- Foley. I was the Medical Assistant that was caring for you today. If you have any questions or concerns regarding today's visit, please feel free to give me a call at [phone]. If you receive a survey in the mail, we would appreciate your feedback. Thank you for choosing [facility] for all your healthcare needs.

## 2012-09-21 NOTE — Progress Notes (Signed)
See procedure note for complete details.

## 2012-09-21 NOTE — Procedures (Signed)
FP Procedure Note    Chanler Artis is a 52 year old male is here for excisional biopsy    Procedure: excision of sebaceous cyst    Pre-procedure diagnosis: sebaceous cyst  Post-procedure diagnosis: same    Indication:  Desires removal    BP 106/67  Pulse 64  Temp(Src) 97.7 F (36.5 C) (Oral)  Wt 87.091 kg (192 lb)  BMI 27.55 kg/m2  Pain before procedure: No acute pain.     Consent form completed and signed? yes by patient:    Site(s) verbally verified? yes with patient  List site(s):  L axilla    Pre-procedure Medication:  None    Summary of Procedure/Findings:  Area was prepped with betadine scrub, then anesthetized with 2% lidocaine with epinepherine.     Excisional Biopsy:  A 2 cm long incision was made over the lesion using a #15 blade.  1.5cm sebaceous cyst was removed with capsule intact.  Skin was closed with 4-0 vicryl in subcuticular fashion.    The patient tolerated the procedure well.  There were no complications.    Post-procedure Medications: None     Acquanetta Belling, M.D.  Department of Family and Preventive Medicine  Pager [phone]

## 2012-09-21 NOTE — Telephone Encounter (Signed)
Spoke with patient and informed.     Also, patient requesting CPT code for today's visit. CPT code not found.

## 2012-09-23 NOTE — Telephone Encounter (Signed)
CPT code for 09/21/2012 is 11602.

## 2012-09-23 NOTE — Telephone Encounter (Signed)
Spoke with patient and tried to give him the CPT code, but he said he was driving, and asked me to call him back and leave a message and I did.

## 2012-09-24 ENCOUNTER — Telehealth (INDEPENDENT_AMBULATORY_CARE_PROVIDER_SITE_OTHER): Payer: Self-pay | Admitting: Family Medicine

## 2012-09-24 NOTE — Telephone Encounter (Signed)
Error

## 2012-09-28 ENCOUNTER — Encounter (INDEPENDENT_AMBULATORY_CARE_PROVIDER_SITE_OTHER): Payer: Self-pay | Admitting: Anesthesiology

## 2012-09-28 ENCOUNTER — Telehealth (INDEPENDENT_AMBULATORY_CARE_PROVIDER_SITE_OTHER): Payer: Self-pay | Admitting: Family Medicine

## 2012-09-28 NOTE — Telephone Encounter (Signed)
RECEIVED 1 PAGE OF LUMBOSACRAL SPINE (2-3 VIEWS) RESULTS FROM [facility]  1 PAGE PLACED IN DR LEE'S IN BOX FOR REVIEW

## 2012-09-28 NOTE — H&P (Signed)
Preoperative History and Physical    Surgical Attending:  Kathalina Ostermann. Sabino Snipes, MD    Primary Care Physician: Delbert Phenix., MD    C/C:  This is a 52 year old male who presents to the [facility] for preoperative history and physical.  Patient is scheduled for excision of mass on wrist with Dr. Josephina Gip on DOS: 10/01/2012 at the [facility].    HPI:  (as per Dr. Sabino Snipes, in EMR): This is a 52 year old male with Left wrist mass that has been growing for the past several months. The mass is over the dorsoradial aspect of his Left wrist just distal to the extensor retinaculum. Of note he had a prior saw injury to his left thumb that was ultimately successfully covered with a great toe wraparound flap approximately 20 years ago the mass is proximal to the incision for his flap.    Pain Score: 4-5/10  Location: Sciatica: lower left back to Left knee  Quality: varies    Patient Active Problem List    Diagnosis Date Noted   . Sciatica of left side    . Mass      Let wrist mass     . Sciatica 09/03/2012   . Chronic lower back pain 09/03/2012   . Paresthesia of lower extremity 09/03/2012   . Ganglion cyst of wrist 09/03/2012   . Hyperlipidemia 04/26/2012   . GERD (gastroesophageal reflux disease) 04/26/2012   . Hypertensive disorder 10/24/2009   . Hypertension 10/24/2009   . Actinic keratosis 10/23/2009       Past Medical History   Diagnosis Date   . Hypertension 10/24/2009   . Actinic keratosis 10/23/2009   . Mass      Let wrist mass   . GERD (gastroesophageal reflux disease)    . Sciatica of left side    . Hyperlipidemia        Past Surgical History   Procedure Laterality Date   . S/p left thumb reconstruction  ~1992 x 6 surgeries     with a great toe wraparound flap approximately 20 years ago    . Wisdom teeth extractions         No Known Allergies    Current Outpatient Prescriptions   Medication Sig Dispense Refill   . aspirin 81 MG EC tablet Take 1 tablet by mouth daily.  1000 tablet  2   . gabapentin (NEURONTIN) 300 MG capsule Take 300 mg by  mouth 2 times daily.       . hydrochlorothiazide (HYDRODIURIL) 25 MG tablet Take 1 tablet by mouth daily.  30 tablet  3   . ibuprofen (MOTRIN) 800 MG tablet Take 800 mg by mouth every 6 hours as needed for Mild Pain (Pain Score 1-3).       Marland Kitchen lisinopril (PRINIVIL, ZESTRIL) 10 MG tablet Take 1 tablet by mouth daily.  90 tablet  3   . Omega-3 Fatty Acids (FISH OIL) 500 MG capsule Take 2 capsules by mouth 2 times daily.  120 capsule  6   . omeprazole (PRILOSEC OTC) 20 MG EC tablet Take 20 mg by mouth daily.  90 tablet  0   . simvastatin (ZOCOR) 20 MG tablet Take 1 tablet by mouth every evening.  90 tablet  3     No current facility-administered medications for this visit.     *(Pt reports last dose of aspirin 81 mg and Ibuprofen 800 mg was today on 09/29/2012.)    History  Social History   . Marital Status: Married     Spouse Name: N/A     Number of Children: 2   . Years of Education: N/A     Occupational History   . retired; Artist      Social History Main Topics   . Smoking status: Current Some Day Smoker -- 0.50 packs/day for 10 years     Types: Cigarettes   . Smokeless tobacco: Never Used      Comment: Currently smokes electronic cigarettes: Vapor: Fills it twice weekly. Using this since ~04/2012   . Alcohol Use: 1.5 oz/week     3 drink(s) per week   . Drug Use: 1.00 per week     Special: Marijuana   . Sexually Active: Not on file     Other Topics Concern   . Not on file     Social History Narrative   . No narrative on file       No family history on file.    Review of Systems:  General:  Negative for fatigue, unexplained wt. loss, fevers, chills or night sweats.  Skin: Negative for rashes, sores or infection.  Eyes: Negative for visual changes, diplopia or blurry vision.  Wears glasses. Negative for cataract extractions/IOL's.  Ears/Nose/Throat/Mouth: Positive for hx of epistaxis. Negative for dental infection or problems.  Negative for sore throat or congestion. No retainers, dentures or  partials.  Respiratory: Negative for cough, sputum production, wheezing, or OSA, (sleep apnea). No snoring. No oxygen.  Cardiovascular: Positive for HPTN. Negative for chest pain, palpitations, syncope, orthopnea or PND. Negative for CAD, heart stents, arrythmias, pacemaker, or ICD.  Exercises daily: > 4 METS.  Walks and gardening outside daily. Denies exertional sxs.  Gastrointestinal: Positive for GERD: stable on PPI. Negative for nausea, vomiting, diarrhea, constipation, melena or hematochezia.    Genitourinary: Negative for dysuria, urgency, frequency, or hesitancy.  Nocturia X 0-1.  Musculoskeletal: Positive for chronic sciatica: lower left back pain to Left knee  Neurologic: Positive for  Some numbness in his Left lower leg posterior to Left knee. Negative for history of headaches, migraines, seizures, strokes, or weakness.  Psychiatric: Negative for insomnia, anxiety, depression, bipolar, PTSD, ADD/ADHD, or other.  Hematologic/Lymphatic/Immunologic: Negative for anemia or bleeding problems.  Negative for DVT or PE history.  No blood transfusion history.  Endocrine: Negative for thyroid or diabetes problems.  Infectious diseases: Negative for MRSA, staph, TB, or Hepatitis.  Cancer history: Negative.    Physical Exam:   Vitals: BP 127/73  Pulse 63  Temp(Src) 98.3 F (36.8 C) (Oral)  Resp 16  Ht 5\' 10"  (1.778 Jamien Casanova)  Wt 86.7 kg (191 lb 2.2 oz)  BMI 27.43 kg/m2  SpO2 98%     General: NAD, alert and oriented to TPP and situation, PERRL, speech clear.  Skin: normal color, turgor; no rashes, lesions, or open sores.  HEENT: O/P: essentially clear. Class I airway.  + prognath. No retainers, dentures, or partials.  Neck: supple. Nontender, FROM, without masses. No SC adenopathy.  No thyromegaly.  Cardiovascular: S1, S2 with no murmurs, rubs, or gallops.  Pulmonary: No respiratory distress or labored breathing; BS's: CTA bilaterally.  Abdomen: Nontender, nondistended, active bowel sounds; no palpable masses or  organomegaly.  Breast/GU/Repro/Rectal: deferred to surgeon/PCP  Musculoskeletal: + Left wrist mass: +TTP; sl mobile, nonred. MAE's x 4. Endorses Left sided low back and LLE pain c/w chronic sciatica with current pain 4-5+/10: Exam deferred.  Neuro: 5+/5+ strength bilaterally.  No focal findings.  Grossly normal.  Extremities: 2+ /2+ Radial and DP pulses, no pedal edema.  Psych: Normal affect; no evidence of physical, emotional, or financial abuse.    Labs:  Lab Results   Component Value Date    NA 137 12/18/2011    K 4.6 12/18/2011    CL 96 12/18/2011    BICARB 29 12/18/2011    BUN 26 12/18/2011    CREAT 1.17 12/18/2011    GLU 103 12/18/2011    [place] 10.4 12/18/2011    AST 27 12/18/2011    ALT 34 12/18/2011    ALK 64 12/18/2011    TBILI 0.4 12/18/2011    ALB 4.8 12/18/2011    TP 7.9 12/18/2011     Lab Results   Component Value Date    WBC 7.2 12/18/2011    RBC 5.64 12/18/2011    HGB 18.3 12/18/2011    HCT 51.4 12/18/2011    MCV 91.1 12/18/2011    MCHC 35.6 12/18/2011    RDW 12.5 12/18/2011    PLT 249 12/18/2011    MPV 9.7 12/18/2011    LYMPHS 38 12/18/2011    MONOS 9 12/18/2011    EOS 2 12/18/2011     No results found for this basename: PT,  INR,  PTT     EKG: 09/29/2012: NSR, 61 BPM, with sinus arrythmia.    Assessment:  -52 year old male with enlarging, tender Left wrist mass.  -Preoperative history and physical completed today.    Plan:  To have: excision of Left wrist mass with Dr. Josephina Gip on DOS: 10/07/2012 at the [facility].  1. Patient advised to not eat or drink anything after 12 MN the night prior to surgery. However, patient advised to only take the following medications the morning of surgery with sips of water: HCTZ and Gabapentin.  Advised to NOT take Lisinopril on the day of surgery.  2. Patient advised that prior to surgery, NOT TO HOLD THEIR NORMAL PRESCRIPTION MEDICATIONS unless advised otherwise.   3. Hold ASA, ibuprofen, naproxen, Mobic,and other NSAID's, Celebrex, vitamin E, vitamins, Fish Oil/Omega 3's, Co Q- 10, Chondroitin/Glucosamine/MSM and other  herbal supplements for 7-10 days prior to surgery.   4. It is OK to take acetaminophen, (Tylenol) and other prescription pain medications not containing aspirin or ibuprofen as needed.   *(Pt reports last dose of aspirin 81 mg and Ibuprofen 800 mg was today on 09/29/2012.)  5. Advised to follow the [facility] preop instructions given.    Code Status: Full Code/Full Care

## 2012-09-29 ENCOUNTER — Ambulatory Visit (INDEPENDENT_AMBULATORY_CARE_PROVIDER_SITE_OTHER): Payer: BC Managed Care – PPO

## 2012-09-29 ENCOUNTER — Encounter (INDEPENDENT_AMBULATORY_CARE_PROVIDER_SITE_OTHER): Payer: Self-pay

## 2012-09-29 VITALS — BP 127/73 | HR 63 | Temp 98.3°F | Resp 16 | Ht 70.0 in | Wt 191.1 lb

## 2012-09-29 DIAGNOSIS — IMO0002 Reserved for concepts with insufficient information to code with codable children: Secondary | ICD-10-CM | POA: Insufficient documentation

## 2012-09-29 MED ORDER — IBUPROFEN 800 MG OR TABS: 800.00 mg | ORAL_TABLET | Freq: Four times a day (QID) | ORAL | Status: AC | PRN

## 2012-09-29 MED ORDER — GABAPENTIN 300 MG OR CAPS
300.00 mg | ORAL_CAPSULE | Freq: Two times a day (BID) | ORAL | Status: DC
Start: ? — End: 2013-05-05

## 2012-09-29 NOTE — Patient Instructions (Signed)
Patient preoperative instructions:  1. Nothing to eat or drink after midnight the night prior to surgery.  However, please only take the following medications the morning of surgery with sips of water: Hydrochlorothiazide and Gabapentin.   Please do NOT take Lisinopril, (Prinivil or Zestril) on the day of surgery.  2. Please hold any aspirin, ibuprofen, aleve, naprosyn, Mobic, and other nonsteroidal anti-inflammatory drugs, Celebrex, fish oil, Co-Q 10, Glucosamine/Chondroitin/MSM, vitamin E, other vitamins and herbal supplements for 7 days prior to surgery.  3. Prior to surgery, DO NOT HOLD YOUR NORMAL PRESCRIPTION MEDICATIONS unless advised otherwise.   4. You may take acetaminophen, (Tylenol) or other prescription pain meds not containing aspirin/ibuprofen prior to surgery if needed.

## 2012-09-29 NOTE — Progress Notes (Signed)
Preoperative Anesthesiology Progress Note    The patient was seen in the Pre[facility] as part of a preoperative evaluation.    A thorough PMH and PSH was obtained and a focused physical exam was done.      At the conclusion of the visit, detailed preoperative instructions were given including:  -NPO instructions  -Preoperative medication instructions (prescription medications, supplements, vitamins, OTC medications)

## 2012-09-30 NOTE — Telephone Encounter (Signed)
XR LS spine: (09/28/2012)  Conclusion: minimal degenerative changes    Please scan into EPIC and mail pt and copy as well. In my outbox. Thank you!

## 2012-10-01 ENCOUNTER — Ambulatory Visit (HOSPITAL_COMMUNITY): Admit: 2012-10-01 | Payer: Self-pay | Admitting: Hand Surgery

## 2012-10-01 DIAGNOSIS — M6740 Ganglion, unspecified site: Secondary | ICD-10-CM

## 2012-10-02 LAB — ECG 12-LEAD
QRS INTERVAL/DURATION: 94 ms
VENTRICULAR RATE: 61 {beats}/min

## 2012-10-12 ENCOUNTER — Ambulatory Visit (INDEPENDENT_AMBULATORY_CARE_PROVIDER_SITE_OTHER): Payer: BC Managed Care – PPO | Admitting: Hand Surgery

## 2012-10-12 VITALS — BP 129/80 | HR 72 | Temp 99.1°F | Ht 70.0 in | Wt 191.0 lb

## 2012-10-12 NOTE — Progress Notes (Signed)
Patient's preferred Physical Therapy Location:   [facility], [address] [place] [address] Tel: [phone]  Fax: [phone]

## 2012-10-13 ENCOUNTER — Encounter (INDEPENDENT_AMBULATORY_CARE_PROVIDER_SITE_OTHER): Payer: Self-pay | Admitting: Family Medicine

## 2012-10-13 NOTE — Telephone Encounter (Signed)
From: Hermenia Fiscal  To: Delbert Phenix, MD  Sent: 10/13/2012 12:18 PM PDT  Subject: 2-Procedural Question    Hello Dr. Nedra Hai,  I completed your orders for an MRI on Friday, 09/25/12 and for an x-ray on Monday, 09/28/12.    Have you received the results? If you have, what are the next steps?    Thanks,  Brett Canales

## 2012-10-13 NOTE — Telephone Encounter (Signed)
X-rays received, reviewed by Dr. Nedra Hai and scanned into Epic. Messaged patient via MyChart, inquiring where MRI was done to request MRI results.

## 2012-10-13 NOTE — Telephone Encounter (Signed)
Noemi/green team. I do not believe we have received the results of this pt's MRI.  Can you please contact pt to aid in obtaining the report?    Please inform the pt that we did receive his XR report, and it shows:  XR LS spine: (09/28/2012)   Conclusion: minimal degenerative changes.    Please have him return to our office after we obtain the MRI report for re-evaluation. Thank you.

## 2012-10-14 NOTE — Telephone Encounter (Signed)
Information received from patient via MyChart. Called and spoke to [facility] and requested results of MRI to be faxed to our clinic. Please place results in Dr. Marigene Ehlers inbox when received.    Messaged patient via MyChart results of Xray per Dr. Nedra Hai and advised patient to schedule a follow up with Dr. Nedra Hai.

## 2012-10-15 NOTE — Progress Notes (Signed)
Derrick Werner  Returns for evaluation of his left wrist following excision of a mass.  He is doing well and reports pain is zero out of 10.    Examination shows his wound has healed without consultation.  He has resumed his prior level of function in light of his prior thumb reconstruction with a great toe wraparound flap.  He does report some minor decreased sensation along the dorsal sensory branch of the radial nerve.    Pathology is consistent with a ganglion cyst    Treatment plan: He will continue increasing range of motion and activity without restrictions.  He will monitor his symptoms and will follow up on an as-needed basis.

## 2012-10-16 ENCOUNTER — Telehealth (INDEPENDENT_AMBULATORY_CARE_PROVIDER_SITE_OTHER): Payer: Self-pay | Admitting: Family Medicine

## 2012-10-16 NOTE — Telephone Encounter (Signed)
Please inform pt that we are referring him to orthopedics.  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  Please have pt get a copy of images to bring to the orthopedist.  Thank you.

## 2012-10-16 NOTE — Telephone Encounter (Signed)
Patient returning Noemi's call. Read Dr Marigene Ehlers note verbatim. Patient verbalizes understanding. Will contact [facility] to obtain films. If nothing nothing further needed please close encounter.

## 2012-10-16 NOTE — Telephone Encounter (Signed)
Called patient and left message to call back.     If patient calls back, please read Dr. Marigene Ehlers message and inform patient.    Also, please give the following information to patient:     CONSULT/REFERRAL TO ORTHOPEDICS    PLEASE CALL THE NUMBER LISTED BELOW 5 WORKING DAYS FROM TODAY to schedule your appointment. By that time, your authorization should be in our system and your appointment will be scheduled without delay.    Orthopedics phone numbers  [place]: [phone]  [place]: [phone]

## 2012-10-16 NOTE — Telephone Encounter (Signed)
Received MRI report from [facility]. Exam date 09/25/2012.     MRI report placed in Dr. Marigene Ehlers mailbox.

## 2012-10-21 ENCOUNTER — Telehealth (INDEPENDENT_AMBULATORY_CARE_PROVIDER_SITE_OTHER): Payer: Self-pay | Admitting: Family Medicine

## 2013-03-03 ENCOUNTER — Other Ambulatory Visit (INDEPENDENT_AMBULATORY_CARE_PROVIDER_SITE_OTHER): Payer: Self-pay | Admitting: Family Medicine

## 2013-03-03 MED ORDER — HYDROCHLOROTHIAZIDE 25 MG OR TABS
25.0000 mg | ORAL_TABLET | Freq: Every day | ORAL | Status: DC
Start: 2013-03-03 — End: 2013-05-05

## 2013-03-03 NOTE — Telephone Encounter (Signed)
 Fax request received from [facility] pharmacy regarding refill for hydrochlorothiazide  (HYDRODIURIL ) 25 MG tablet   Patient last seen: 09/03/12  Patient last refill: 04/07/12  Next visit:     None scheduled    Thank you.

## 2013-03-03 NOTE — Telephone Encounter (Signed)
 Refilled hctz for 3 months only. Pt needs to f/u for mmp (htn, hypercholesterolemia, hypertriglyceridemia, elevated uric acid, elevated prostate) and for physical exam in the next 1-2 months. Please inform pt.  Thank you.

## 2013-03-03 NOTE — Telephone Encounter (Signed)
Called patient and left message to call back.     Please read Dr. Lee's message and inform patient. Thank you.

## 2013-03-03 NOTE — Telephone Encounter (Signed)
 Patient returning Derrick Werner's call  I read message from Dr Merlyn Starring to patient verbatim  Patient was also informed of his lab work as well  Patient states that he did not req these labs and in turn will not come in to have the labs done because it is too expensive through his insurance  Patient declined an appt for a f/u labs or a cpe  I informed the patient the our doctors at [facility] do not know what each patient's insurance cover and do not cover and that the doctor orders labs that they feel is necessary for the patient's well being  Please advise

## 2013-03-03 NOTE — Telephone Encounter (Signed)
 Please inform Derrick Werner that we are sorry to hear about his financial situation.  Is he open to getting any blood tests (1 or 2) or even coming in for a follow up for his medical conditions without blood tests (including htn, hypercholesterolemia, hypertriglyceridemia, elevated uric acid, elevated prostate)?    If he is willing, please inform me. If he is not willing or cannot get any of these things done, we will need to figure out another plan for his care.

## 2013-03-04 NOTE — Telephone Encounter (Signed)
 Spoke with patient relayed Dr. Ceola Collie message to him. Patient stated he will be sending a message through [facility].

## 2013-03-11 ENCOUNTER — Other Ambulatory Visit (INDEPENDENT_AMBULATORY_CARE_PROVIDER_SITE_OTHER): Payer: Self-pay | Admitting: Family Medicine

## 2013-03-11 MED ORDER — LISINOPRIL 10 MG OR TABS
10.0000 mg | ORAL_TABLET | Freq: Every day | ORAL | Status: DC
Start: 2013-03-11 — End: 2013-06-21

## 2013-03-16 ENCOUNTER — Encounter (INDEPENDENT_AMBULATORY_CARE_PROVIDER_SITE_OTHER): Payer: Self-pay | Admitting: Family Medicine

## 2013-03-25 ENCOUNTER — Encounter (INDEPENDENT_AMBULATORY_CARE_PROVIDER_SITE_OTHER): Payer: Self-pay | Admitting: Family Medicine

## 2013-05-05 ENCOUNTER — Encounter (INDEPENDENT_AMBULATORY_CARE_PROVIDER_SITE_OTHER): Payer: Self-pay | Admitting: Family Medicine

## 2013-05-05 ENCOUNTER — Other Ambulatory Visit: Payer: BC Managed Care – PPO | Attending: Family Medicine | Admitting: Family Medicine

## 2013-05-05 VITALS — BP 125/83 | HR 77 | Temp 97.2°F | Wt 189.0 lb

## 2013-05-05 DIAGNOSIS — R972 Elevated prostate specific antigen [PSA]: Secondary | ICD-10-CM | POA: Insufficient documentation

## 2013-05-05 DIAGNOSIS — E785 Hyperlipidemia, unspecified: Secondary | ICD-10-CM | POA: Insufficient documentation

## 2013-05-05 DIAGNOSIS — R7989 Other specified abnormal findings of blood chemistry: Secondary | ICD-10-CM | POA: Insufficient documentation

## 2013-05-05 DIAGNOSIS — I10 Essential (primary) hypertension: Secondary | ICD-10-CM | POA: Insufficient documentation

## 2013-05-05 MED ORDER — AMLODIPINE 5 MG OR TABS
5.0000 mg | ORAL_TABLET | Freq: Every day | ORAL | Status: DC
Start: 2013-05-05 — End: 2013-08-06

## 2013-05-05 NOTE — Interdisciplinary (Signed)
 Pre-visit chart review and huddle completed with staff and physician.    Outstanding labs, imaging and consults reviewed and identified.    Health maintanence issues identified and addressed:    Health Maintenance   Topic Date Due   . Influenza Vaccine  01/11/2013   . Imm_td/tdap=>52 Yo  09/29/2016       Patient Active Problem List   Diagnosis   . Actinic keratosis   . Hypertensive disorder   . Hyperlipidemia   . GERD (gastroesophageal reflux disease)   . Sciatica   . Chronic lower back pain   . Paresthesia of lower extremity   . Ganglion cyst of wrist   . Sciatica of left side   . Mass   . Hypertension

## 2013-05-05 NOTE — Patient Instructions (Signed)
My name is Betzy Patrick- Foley. I was the Medical Assistant that was caring for you today. If you have any questions or concerns regarding today's visit, please feel free to give me a call at [phone]. If you receive a survey in the mail, we would appreciate your feedback. Thank you for choosing [facility] for all your healthcare needs.

## 2013-05-19 ENCOUNTER — Telehealth (INDEPENDENT_AMBULATORY_CARE_PROVIDER_SITE_OTHER): Payer: Self-pay | Admitting: Family Medicine

## 2013-05-19 NOTE — Progress Notes (Signed)
SUBJECTIVE:  Derrick Werner is a 53 year old male with chief complaint of Refill Request and Other    Patient Active Problem List   Diagnosis    Actinic keratosis    Hypertensive disorder    Hyperlipidemia    GERD (gastroesophageal reflux disease)    Sciatica    Chronic lower back pain    Paresthesia of lower extremity    Ganglion cyst of wrist    Sciatica of left side    Mass    Hypertension    Tobacco use     Here to discuss several matters. Inquiring which follow up studies are needed considering his numerous abn lab results from 2013. He reports that he has not been good about his diet.  However, he has intentionally lost 9 # since 03/2012.      Glyco Hgb (A1C) Latest Range: 4.8-5.9 % 5.8   Triglycerides Latest Range: 10-170 mg/dL 393 (H)   Cholesterol Latest Range: <200 mg/dL 261 (H)   HDL-Cholesterol No range found 36   LDL-Chol (Calc) Latest Range: <160 mg/dL 146   TSH Latest Range: 0.27-4.20 uIU/mL 2.78   PSA Latest Range: 0.00-3.99 ng/mL 4.20 (H)   Testosterone (Male) Latest Range: 2.8-8.0 ng/mL 2.5 (L)   Uric Acid Latest Range: 3.4-7.0 mg/dL 10.2 (H)   WBC Latest Range: 4.0-10.0 1000/mm3 7.2   RBC Latest Range: 4.60-6.10 mill/mm3 5.64   Hgb Latest Range: 13.7-17.5 gm/dL 18.3 (H)   Hct Latest Range: 40.0-50.0 % 51.4 (H)   MCV Latest Range: 79.0-95.0 um3 91.1   MCH Latest Range: 26.0-32.0 pgm 32.4 (H)   MCHC Latest Range: 32.0-36.0 % 35.6   RDW Latest Range: 12.0-14.0 % 12.5   Plt Count Latest Range: [phone]/mm3 249   MPV Latest Range: 9.4-12.4 fL 9.7   Segs Latest Range: 34-71 % 50   Lymphocytes Latest Range: 19-53 % 38   Monocytes Latest Range: 5-12 % 9   Eosinophils Latest Range: 1-7 % 2   ANC-Automated Latest Range: 1.6-7.0 K/uL 3.6   ANC-Instrument Latest Range: 1.6-7.0 1000/mm3 3.6   Abs Lymphs Latest Range: 0.8-3.1 1000/mm3 2.7   Abs Monos Latest Range: 0.2-0.8 1000/mm3 0.6   Abs Eosinophils Latest Range: 0.0-0.5 1000/mm3 0.2   Diff Type No range found Automated       Also  concerned about onychomycosis for many years. No changes, but nails of toe seem to be thicker and more opaque. No previous hx or w/u for onychomycosis.    PMhx, PShx, MED, ALL, Shx, Fhx reviewed and in EPIC    ROS: as mentioned above  Denies fever/chills/sweats.  Denies URI s/sx.  Denies HA/lightheaded/dizziness.  Denies CP/SOB/N//V/diaphoresis.  Denies palpitations/orthopnea  Denies abd pain/n/v/d/c.  Denies melena/hematochezia.  Denies dysuria/hematuria.  Denies swelling/rash.  Denies visual changes/facial droop/confusion.  Denies focal weakness/numbness/parasthesias.  Denies any new or worsening motor-sensory changes.  Denies saddle anesthesia/sciatica. Denies LE weakness/numbness/parasthesias. Denies bladder/bowel incontinence or retention.  Denies unintentional wt loss. Denies night sweats.    Future Appointments  Date Time Provider E. Lopez   08/06/2013 3:30 PM Donalee Citrin, MD [facility]     Current Outpatient Prescriptions on File Prior to Visit   Medication Sig Dispense Refill    aspirin 81 MG EC tablet Take 1 tablet by mouth daily.  1000 tablet  2    ibuprofen (MOTRIN) 800 MG tablet Take 800 mg by mouth every 6 hours as needed for Mild Pain (Pain Score 1-3).  lisinopril (PRINIVIL, ZESTRIL) 10 MG tablet Take 1 tablet by mouth daily.  90 tablet  0    Omega-3 Fatty Acids (FISH OIL) 500 MG capsule Take 2 capsules by mouth 2 times daily.  120 capsule  6    omeprazole (PRILOSEC OTC) 20 MG EC tablet Take 20 mg by mouth daily.  90 tablet  0    simvastatin (ZOCOR) 20 MG tablet Take 1 tablet by mouth every evening.  90 tablet  3     No current facility-administered medications on file prior to visit.     Allergies as of 05/05/2013    (No Known Allergies)     Immunization History   Administered Date(s) Administered    Influenza Vaccine >=3 Years 03/17/2009    Tdap 09/30/2006       OBJECTIVE:  Filed Vitals:    05/05/13 1311   BP: 125/83   Pulse: 77   Temp: 97.2 F (36.2 C)   TempSrc: Oral     Weight: 85.73 kg (189 lb)     Estimated body mass index is 27.12 kg/(m^2) as calculated from the following:    Height as of 10/12/12: 5\' 10"  (1.778 m).    Weight as of this encounter: 85.73 kg (189 lb).  Gen: NAD, pleasant, comfortable  HEENT: NC/AT, EOMI, PERRL  Neck: full AROM without problem;  CV: RRR, S1+S2+, no M/R/G/C;   Resp: CTAB; no wheezes/rales; good air exchange  Abd: soft; NT/ND;  LE:   irregularity and opaqueness of tonails      ASSESSMENT/PLAN:  Tymir was seen today for refill request and other. Multiple medical issues and questions, which were all answered and pt verbalizes that there are no more questions or concerns and that all were answered and addressed today in person.  He has not been cooperative in getting labs and w/u of mmp in the past  but is now willing to get labs and f/u regularly if possible.    Diagnoses and associated orders for this visit:    Hyperlipidemia: elevated; recheck; discussed at great length TLC and appropriate  Diet and management  - Comprehensive Metabolic Panel Green Plasma Separator Tube; Future  - Lipid Panel Green Plasma Separator Tube; Future  - Comprehensive Metabolic Panel Green Plasma Separator Tube  - Lipid Panel Green Plasma Separator Tube      Hypertensive disorder: d/c hctz; start ccb due to elevate uric acid  - amLODIPINE (NORVASC) 5 MG tablet; Take 1 tablet by mouth daily.  - Comprehensive Metabolic Panel Green Plasma Separator Tube    Elevated uric acid in blood: asymptomatic; discussed at great length wt loss, lifestyle changes and discontinuiing diuretic and starting ccb  - Uric Acid, Blood Green Plasma Separator Tube    Elevated PSA  - PSA (Screen), Blood Green Plasma Separator Tube    Elevated hemoglobin A1c  - Glycosylated Hgb(A1C), Blood [place]    Elevated hemoglobin: elevated h/h from 2013 labsl; get ferritin, iron studies, repeat cbc; based on results, will consider referring to hematology if results abn    Nail abnormalities: ;discussed ddx  with pt; ;recommended toenail ctx/pcr to rule out onychomycosis; pt declines at this time.        Spent greater than 75 min with pt with greater than 50% of time spent on counseling and face-to-face.      Patient Instruction:  See progress note.   Barriers to Learning assessed: none.   Patient verbalizes understanding of teaching and instructions.    Medication Review:  Medications reviewed with patient and medication list reconciled.  Over the counter medications, herbal therapies and supplements reviewed.  Patient's understanding and response to medications assessed.    Barriers to medications assessed and addressed.   Risks, benefits, alternatives to medications reviewed.      Education and Case Management:  Goals of management reviewed with patient.  Barriers to achieving goals reviewed and addressed with patient.      Updated medication list:  Current Outpatient Prescriptions   Medication Sig    amLODIPINE (NORVASC) 5 MG tablet Take 1 tablet by mouth daily.    aspirin 81 MG EC tablet Take 1 tablet by mouth daily.    ibuprofen (MOTRIN) 800 MG tablet Take 800 mg by mouth every 6 hours as needed for Mild Pain (Pain Score 1-3).    lisinopril (PRINIVIL, ZESTRIL) 10 MG tablet Take 1 tablet by mouth daily.    Omega-3 Fatty Acids (FISH OIL) 500 MG capsule Take 2 capsules by mouth 2 times daily.    omeprazole (PRILOSEC OTC) 20 MG EC tablet Take 20 mg by mouth daily.    simvastatin (ZOCOR) 20 MG tablet Take 1 tablet by mouth every evening.     No current facility-administered medications for this visit.

## 2013-05-19 NOTE — Telephone Encounter (Signed)
Please contact pt.  On review of his chart, he did not get all the lab tests that we wanted him to get on his 05/05/2013 visit. Specifically, CBC, ferritin, iron studies. His hemoglobin and hematocrit was elevated in a previous blood test from 2013. I recommend that he get these labs done ASAP.   There is a concern for hemochromatosis, which could lead to many complications if not addressed, which may include:  Liver problems. Cirrhosis -- permanent scarring of the liver. Cirrhosis increases your risk of liver cancer and other life-threatening complications.   Pancreas problems. Damage to the pancreas can lead to diabetes.   Heart problems. Excess iron in your heart affects the heart's ability to circulate enough blood for your body's needs. This is called congestive heart failure.  Reproductive problems. Excess iron can lead to erectile dysfunction (impotence).  Skin color changes. Deposits of iron in skin cells can make your skin appear bronze or gray in color.

## 2013-05-20 NOTE — Telephone Encounter (Signed)
Spoke with pt and explained to pt what was requesting to be done. Pt is stating he does not want to have this blood work done. He feels this is based off of blood work from 2013. He feels that you changed the medication based on labs from 2013. Please advise

## 2013-05-20 NOTE — Telephone Encounter (Signed)
Called patient and left message to call back.     Please read Dr. Lee's message and inform patient. Thank you.

## 2013-05-20 NOTE — Telephone Encounter (Signed)
He should at least get a repeat cbc to ensure resolution of the elevated hemoglobin and hematocrit. If it is still elevated then i would strongly recommend a thorough work up. Please inform pt. Thank you.

## 2013-05-21 NOTE — Telephone Encounter (Signed)
Patient called back, relayed information below. Per patient he is denying the labs as he needs to "read more into it". Patient states he does not feel these labs are necessary and will research and call back if he decides to get labs done. Will forward as FYI.

## 2013-05-28 ENCOUNTER — Other Ambulatory Visit: Payer: BC Managed Care – PPO | Attending: Family Medicine

## 2013-05-28 DIAGNOSIS — D582 Other hemoglobinopathies: Principal | ICD-10-CM | POA: Insufficient documentation

## 2013-05-28 DIAGNOSIS — E785 Hyperlipidemia, unspecified: Secondary | ICD-10-CM | POA: Insufficient documentation

## 2013-05-28 DIAGNOSIS — Z0000 Encounter for general adult medical examination without abnormal findings: Secondary | ICD-10-CM

## 2013-05-28 DIAGNOSIS — I10 Essential (primary) hypertension: Secondary | ICD-10-CM | POA: Insufficient documentation

## 2013-05-28 LAB — IBC - IRON BINDING CAPACITY
Iron Saturation: 26 %
Total IBC: 325 ug/dL (ref 148–506)
UIBC: 239 ug/dL (ref 112–346)

## 2013-05-28 LAB — CBC WITH DIFF, BLOOD
ANC-Automated: 3.7 10*3/uL (ref 1.6–7.0)
Abs Eosinophils: 0.3 10*3/uL (ref 0.1–0.5)
Abs Lymphs: 2.8 10*3/uL (ref 0.8–3.1)
Abs Monos: 0.6 10*3/uL (ref 0.2–0.8)
Basophils: 1 % (ref 0–2)
Eosinophils: 3 % (ref 1–4)
Hct: 45.1 % (ref 40.0–50.0)
Hgb: 15.6 gm/dL (ref 13.7–17.5)
Lymphocytes: 38 % (ref 19–53)
MCH: 32.1 pg — ABNORMAL HIGH (ref 26.0–32.0)
MCHC: 34.6 % (ref 32.0–36.0)
MCV: 92.8 um3 (ref 79.0–95.0)
MPV: 9.7 fL (ref 9.4–12.4)
Monocytes: 8 % (ref 5–12)
Plt Count: 255 10*3/uL (ref 140–370)
RBC: 4.86 10*6/uL (ref 4.60–6.10)
RDW: 12.9 % (ref 12.0–14.0)
Segs: 50 % (ref 34–71)
WBC: 7.4 10*3/uL (ref 4.0–10.0)

## 2013-05-28 LAB — RANDOM URINE MICROALB/CREAT RATIO PANEL
Creatinine, Urine: 90 mg/dL (ref 40–278)
Microalbumin, Urine: <1.2 mg/dL (ref <2.0)

## 2013-05-28 LAB — FERRITIN, BLOOD: Ferritin: 131 ng/mL (ref 30–400)

## 2013-05-28 LAB — IRON, BLOOD: Iron: 86 ug/dL (ref 59–158)

## 2013-06-01 ENCOUNTER — Encounter: Payer: Self-pay | Admitting: Hospital

## 2013-06-18 ENCOUNTER — Other Ambulatory Visit (INDEPENDENT_AMBULATORY_CARE_PROVIDER_SITE_OTHER): Payer: Self-pay | Admitting: Family Medicine

## 2013-06-18 DIAGNOSIS — I10 Essential (primary) hypertension: Principal | ICD-10-CM

## 2013-06-18 DIAGNOSIS — E785 Hyperlipidemia, unspecified: Principal | ICD-10-CM

## 2013-06-18 NOTE — Telephone Encounter (Signed)
Patient's spouse called requesting rx refill for the following medication(s). Per patients spouse the medication below should have not been ended. Per spouse the medication below has worked (per recent lab results) and is requesting the medication below to be re-started.      HYDROCHLOROTHIAZIDE 25 MG OR TABS    Pharmacy:[id number] -- [place] -- [place], [place] [address] -- [phone] -- [phone]    Patient has been notified of 24-72 hour timeframe, patient expressed understanding.     Please advise.

## 2013-06-21 NOTE — Telephone Encounter (Signed)
From: Susanne Borders  To: Donalee Citrin, MD  Sent: 06/18/2013 4:14 PM PST  Subject: Medication Renewal Request    Original authorizing provider: Donalee Citrin, MD    Ceiba would like a refill of the following medications:  lisinopril (PRINIVIL, ZESTRIL) 10 MG tablet Donalee Citrin, MD]    Preferred pharmacy: [id number] -- [place] -- [place], [place] [address] -- [phone] -- [phone]    Comment:      Medication renewals requested in this message routed to other providers:  simvastatin (ZOCOR) 20 MG tablet Marlane Mingle, MD]

## 2013-06-21 NOTE — Telephone Encounter (Signed)
From: Susanne Borders  To: Marlane Mingle, MD  Sent: 06/18/2013 4:14 PM PST  Subject: Medication Renewal Request    Original authorizing provider: Marlane Mingle, MD    Madilyn Fireman Redlich would like a refill of the following medications:  simvastatin (ZOCOR) 20 MG tablet Marlane Mingle, MD]    Preferred pharmacy: [id number] -- [place] -- [place], [place] [address] -- [phone] -- [phone]    Comment:      Medication renewals requested in this message routed to other providers:  lisinopril (PRINIVIL, ZESTRIL) 10 MG tablet Donalee Citrin, MD]

## 2013-06-21 NOTE — Telephone Encounter (Signed)
Request received from patient regarding refill for hydrochlorothiazide (HYDRODIURIL) 25 MG tablet  Patient last seen: 05/05/13  Patient last refill: 03/03/13  Next visit:     08/06/13    Thank you.

## 2013-06-22 MED ORDER — LISINOPRIL 10 MG OR TABS
10.0000 mg | ORAL_TABLET | Freq: Every day | ORAL | Status: DC
Start: 2013-06-22 — End: 2013-08-06

## 2013-06-22 MED ORDER — SIMVASTATIN 20 MG OR TABS
20.0000 mg | ORAL_TABLET | Freq: Every evening | ORAL | Status: DC
Start: 2013-06-22 — End: 2013-08-06

## 2013-06-22 MED ORDER — HYDROCHLOROTHIAZIDE 25 MG OR TABS
25.0000 mg | ORAL_TABLET | Freq: Every day | ORAL | Status: DC
Start: 2013-06-22 — End: 2013-08-06

## 2013-06-22 NOTE — Telephone Encounter (Signed)
Called and spoke to patient and informed.

## 2013-08-06 ENCOUNTER — Ambulatory Visit (INDEPENDENT_AMBULATORY_CARE_PROVIDER_SITE_OTHER): Payer: BC Managed Care – PPO | Admitting: Family Medicine

## 2013-08-06 VITALS — BP 139/82 | HR 97 | Temp 97.3°F | Resp 20 | Wt 187.0 lb

## 2013-08-06 DIAGNOSIS — K219 Gastro-esophageal reflux disease without esophagitis: Secondary | ICD-10-CM

## 2013-08-06 DIAGNOSIS — R5381 Other malaise: Secondary | ICD-10-CM

## 2013-08-06 DIAGNOSIS — R5383 Other fatigue: Secondary | ICD-10-CM

## 2013-08-06 DIAGNOSIS — T148XXA Other injury of unspecified body region, initial encounter: Secondary | ICD-10-CM

## 2013-08-06 DIAGNOSIS — M549 Dorsalgia, unspecified: Secondary | ICD-10-CM

## 2013-08-06 DIAGNOSIS — E785 Hyperlipidemia, unspecified: Principal | ICD-10-CM

## 2013-08-06 DIAGNOSIS — IMO0002 Reserved for concepts with insufficient information to code with codable children: Secondary | ICD-10-CM

## 2013-08-06 DIAGNOSIS — M48061 Spinal stenosis, lumbar region without neurogenic claudication: Secondary | ICD-10-CM

## 2013-08-06 DIAGNOSIS — E790 Hyperuricemia without signs of inflammatory arthritis and tophaceous disease: Secondary | ICD-10-CM

## 2013-08-06 DIAGNOSIS — M4807 Spinal stenosis, lumbosacral region: Secondary | ICD-10-CM

## 2013-08-06 DIAGNOSIS — I10 Essential (primary) hypertension: Secondary | ICD-10-CM

## 2013-08-06 DIAGNOSIS — R7989 Other specified abnormal findings of blood chemistry: Secondary | ICD-10-CM

## 2013-08-06 DIAGNOSIS — G8929 Other chronic pain: Secondary | ICD-10-CM

## 2013-08-06 DIAGNOSIS — G5600 Carpal tunnel syndrome, unspecified upper limb: Secondary | ICD-10-CM

## 2013-08-06 MED ORDER — LISINOPRIL 10 MG OR TABS
10.0000 mg | ORAL_TABLET | Freq: Every day | ORAL | Status: DC
Start: 2013-08-06 — End: 2014-08-23

## 2013-08-06 MED ORDER — OMEPRAZOLE 20 MG OR TBEC
20.0000 mg | DELAYED_RELEASE_TABLET | Freq: Every day | ORAL | Status: DC
Start: 2013-08-06 — End: 2015-03-01

## 2013-08-06 MED ORDER — HYDROCHLOROTHIAZIDE 25 MG OR TABS
25.0000 mg | ORAL_TABLET | Freq: Every day | ORAL | Status: DC
Start: 2013-08-06 — End: 2014-08-23

## 2013-08-06 MED ORDER — SIMVASTATIN 40 MG OR TABS
40.0000 mg | ORAL_TABLET | Freq: Every evening | ORAL | Status: DC
Start: 2013-08-06 — End: 2014-08-23

## 2013-08-06 NOTE — Interdisciplinary (Signed)
Pre-visit chart review and huddle completed with staff and physician.    Outstanding labs, imaging and consults reviewed and identified.    Health maintanence issues identified and addressed:    Health Maintenance   Topic Date Due   . INFLUENZA VACCINE  01/11/2013   . IMM_TD/TDAP=>53 YO  09/29/2016

## 2013-08-06 NOTE — Progress Notes (Signed)
SUBJECTIVE:  Derrick Werner is a 53 year old male with chief complaint of Other    Patient Active Problem List   Diagnosis    Actinic keratosis    Hypertensive disorder    Hyperlipidemia    GERD (gastroesophageal reflux disease)    Sciatica    Chronic lower back pain    Paresthesia of lower extremity    Ganglion cyst of wrist    Sciatica of left side    Mass    Hypertension    Tobacco use     1) medications: did not get amlodipine due to cost; desires to stay on hctz and lisinopril though he is aware that hctz is a contributing cause of hyperuricemia, which can have harmful sequela including increased CV risk; no gout attacks hx    2) Cramping of calves and forearms and fingers; occurs during daytime and night, though calf cramping most common at night.  - always working with hands and feet and legs, being in specific positions for extended long periods of time.  Performs repetative movements frequency and for long periods of time; working on cars, woodwork, etc.     3) fatigue: not feeling rested when awaking. Daytime sleepiness. +snoring. Wife with pt today and reports that he does snore and has restless sleep. Does not report apneic episodes. No changes in exercise tolerance. Denies CP/SOB/N//V/diaphoresis.  Denies unintentional wt loss. Denies night sweats. Denies swelling/orthopnea.  Still very active physically.    4) Toenail whitening at distal great toes b/l x 2+ years. No trauma. No thickening. No progression/worsening. No pain. No other toenail discoloration.     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 Tried gabapentin, but reports that it was not helpful. Take ibuprofen PRN for back pain and this helps.   - Denies saddle anesthesia. Denies LE weakness.  Denies bladder/bowel incontinence or retention.  - of note, pt has had 30+ years of lower back pain with L sciatica, paresthesias and numbness of LLE    6) pain in distribution of median nerve b/l x several months. Comes and goes. As stated above,  performs repetative movements frequency and for long periods of time; working on cars, [employer], [employer] research officer, government.         PMhx, PShx, MED, ALL, Shx, Fhx reviewed and in EPIC    ROS: as mentioned above  Denies fever/chills/sweats.  Denies URI s/sx.  Denies HA/lightheaded/dizziness.  Denies palpitations/orthopnea  Denies abd pain/n/v/d/c.  Denies melena/hematochezia.  Denies dysuria/hematuria.  Denies swelling/rash.  Denies visual changes/facial droop/confusion.  Denies focal weakness/numbness/parasthesias except stated above.   Denies any new or worsening motor-sensory changes.      No future appointments.  Current Outpatient Prescriptions on File Prior to Visit   Medication Sig Dispense Refill    aspirin 81 MG EC tablet Take 1 tablet by mouth daily.  1000 tablet  2    ibuprofen (MOTRIN) 800 MG tablet Take 800 mg by mouth every 6 hours as needed for Mild Pain (Pain Score 1-3).        Omega-3 Fatty Acids (FISH OIL) 500 MG capsule Take 2 capsules by mouth 2 times daily.  120 capsule  6     No current facility-administered medications on file prior to visit.  Allergies as of 08/06/2013    (No Known Allergies)     Immunization History   Administered Date(s) Administered    Influenza Vaccine >=3 Years 03/17/2009    Tdap 09/30/2006       OBJECTIVE:  Filed Vitals:    08/06/13 1527   BP: 139/82   Pulse: 97   Temp: 97.3 F (36.3 C)   TempSrc: Oral   Resp: 20   Weight: 84.823 kg (187 lb)     Estimated body mass index is 26.83 kg/(m^2) as calculated from the following:    Height as of 10/12/12: 5\' 10"  (1.778 m).    Weight as of this encounter: 84.823 kg (187 lb).  Gen: NAD, pleasant, comfortable      Results for orders placed in visit on 05/28/13   CBC WITH ADIFF, BLOOD       Result  Value Ref Range    WBC 7.4  4.0 - 10.0 1000/mm3    RBC 4.86  4.60 - 6.10 mill/mm3    Hgb 15.6  13.7 - 17.5 gm/dL    Hct 45.1  40.0 - 50.0 %    MCV 92.8  79.0 - 95.0 um3    MCH 32.1 (*) 26.0 - 32.0 pgm    MCHC 34.6  32.0 - 36.0 %    RDW 12.9  12.0 - 14.0 %    MPV 9.7  9.4 - 12.4 fL    Plt Count 255  140 - 370 1000/mm3    Segs 50  34 - 71 %    Lymphocytes 38  19 - 53 %    Monocytes 8  5 - 12 %    Eosinophils 3  <1 - 4 %    Basophils 1  0 - 2 %    ANC-Automated 3.7  1.6 - 7.0 1000/mm3    Abs Lymphs 2.8  0.8 - 3.1 1000/mm3    Abs Monos 0.6  0.2 - 0.8 1000/mm3    Abs Eosinophils 0.3  <0.1 - 0.5 1000/mm3    Diff Type Automated     IRON, BLOOD       Result Value Ref Range    Iron 86  59 - 158 mcg/dL   UIBC, BLOOD       Result Value Ref Range    UIBC 239  112 - 346 mcg/dL    Total IBC 325  148 - 506 mcg/dL    Iron Saturation 26     FERRITIN, BLOOD       Result Value Ref Range    Ferritin 131  30 - 400 ng/mL   RANDOM URINE MICROALB/CREAT RATIO PANEL       Result Value Ref Range    Microalbumin, Urine <1.2  <2.0 mg/dL    Creatinine, Urine 90  40 - 278 mg/dL    MALB/CR Ratio Random see below  0 - 30 mcg/mgCr     Results for DORA, CLAUSS (MRN [id number]) as of 08/06/2013 18:30   Ref. Range 05/05/2013 14:44   Sodium Latest Range: 136-145 mmol/L 139   Potassium Latest Range: 3.5-5.1 mmol/L 4.3   Chloride Latest Range: 98-107 mmol/L 99   Bicarbonate Latest Range: 22-29 mmol/L 26   BUN Latest Range: 6-20 mg/dL 19   Creatinine Latest Range: 0.67-1.17 mg/dL 0.93   GFR No range found >60   Glucose Latest Range: 70-115 mg/dL 84   Calcium Latest Range: 8.5-10.6 mg/dL 10.5   Alkaline Phos Latest Range: 40-129 U/L 66   ALT (  SGPT) Latest Range: 0-41 U/L 26   AST (SGOT) Latest Range: 0-40 U/L 26   Bilirubin, Tot Latest Range: <1.20 mg/dL 0.40   ALBUMIN Latest Range: 3.5-5.2 g/dL 4.8   Total Protein Latest Range: 6.0-8.0 g/dL 7.6   Glyco Hgb (A1C) Latest Range: 4.8-5.9 % 5.5   Triglycerides Latest Range: 10-170 mg/dL 169   Cholesterol  Latest Range: <200 mg/dL 191   HDL-Cholesterol No range found 39   Non-HDL Cholesterol No range found 152   LDL-Chol (Calc) Latest Range: <160 mg/dL 118   PSA Latest Range: 0.00-3.99 ng/mL 3.35   Uric Acid Latest Range: 3.4-7.0 mg/dL 7.3 (H)         ASSESSMENT/PLAN:  Barnaby was seen today for other.    Diagnoses and associated orders for this visit:    Hyperlipidemia   Ref. Range 05/05/2013 14:44   Triglycerides Latest Range: 10-170 mg/dL 169   Cholesterol Latest Range: <200 mg/dL 191   HDL-Cholesterol No range found 39   Non-HDL Cholesterol No range found 152   LDL-Chol (Calc) Latest Range: <160 mg/dL 118     - simvastatin (ZOCOR) 40 MG tablet; Take 1 tablet by mouth every evening.  - Comprehensive Metabolic Panel Green Plasma Separator Tube; Future  - Lipid Panel Green Plasma Separator Tube; Future  - Uric Acid, Blood Green Plasma Separator Tube; Future    Hypertensive disorder: in prehtn range;  did not get amlodipine due to cost; desires to stay on hctz and lisinopril though he is aware that hctz is a contributing cause of hyperuricemia, which can have harmful sequela including increased CV risk; no gout attacks hx  - hydrochlorothiazide (HYDRODIURIL) 25 MG tablet; Take 1 tablet by mouth daily.  - lisinopril (PRINIVIL, ZESTRIL) 10 MG tablet; Take 1 tablet by mouth daily.  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    Carpal tunnel  syndrome  - DME Misc Order: bilateral velcro wrist splints; bilateral carpal tunnel syndrome    Avulsion of nail    Elevated uric acid in blood  - Uric Acid, Blood Green Plasma Separator Tube; Future            Spent greater than 45 min with pt with greater than 50% of time spent on counseling and face-to-face.      Patient Instruction:  See progress note.   Barriers to Learning assessed: none.   Patient verbalizes understanding of teaching and instructions.    Medication Review:  Medications reviewed with patient and medication list reconciled.  Over the counter medications, herbal therapies and supplements reviewed.  Patient's understanding and response to medications assessed.    Barriers to medications assessed and addressed.   Risks, benefits, alternatives to medications reviewed.      Education and Case Management:  Goals of management reviewed with patient.  Barriers to achieving goals reviewed and addressed with patient.      Updated medication list:  Current Outpatient Prescriptions  Medication Sig    aspirin 81 MG EC tablet Take 1 tablet by mouth daily.    hydrochlorothiazide (HYDRODIURIL) 25 MG tablet Take 1 tablet by mouth daily.    ibuprofen (MOTRIN) 800 MG tablet Take 800 mg by mouth every 6 hours as needed for Mild Pain (Pain Score 1-3).    lisinopril (PRINIVIL, ZESTRIL) 10 MG tablet Take 1 tablet by mouth daily.    Omega-3 Fatty Acids (FISH OIL) 500 MG capsule Take 2 capsules by mouth 2 times daily.    omeprazole (PRILOSEC OTC) 20 MG Enteric-Coated tablet Take 20 mg by mouth daily.    simvastatin (ZOCOR) 40 MG tablet Take 1 tablet by mouth every evening.     No current facility-administered medications for this visit.

## 2013-08-09 ENCOUNTER — Telehealth (INDEPENDENT_AMBULATORY_CARE_PROVIDER_SITE_OTHER): Payer: Self-pay | Admitting: Family Medicine

## 2013-08-09 ENCOUNTER — Telehealth (INDEPENDENT_AMBULATORY_CARE_PROVIDER_SITE_OTHER): Payer: Self-pay

## 2013-08-09 NOTE — Telephone Encounter (Signed)
REFERRAL TO ORTHOPEDICS  Authorization #: Diagnosis: 724.02 (ICD-9-CM) - Foraminal stenosis of lumbosacral region  724.5, 338.29 (ICD-9-CM) - Chronic back pain    [facility]): [phone]  [place], [place] [address]    The referral has been released for scheduling.

## 2013-08-09 NOTE — Telephone Encounter (Signed)
DME MISCELLANEOUS ORDER  Authorization #: Diagnosis: 354.0 (ICD-9-CM) - Carpal tunnel syndrome    [facility] [phone]  [facility]  [address] [place], [place] [address]  Fax [phone]     The referral has been faxed too the above location, the patient may call at anytime for an appointment.

## 2013-08-09 NOTE — Telephone Encounter (Signed)
See message below.        Left message on machine for pt to return call to clinic.

## 2013-08-09 NOTE — Telephone Encounter (Signed)
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     Left vm for pt to call back to schedule appt. [place] has more availability, please offer that for pt. If pt is requesting [place] please route this message to Spine RN to review

## 2014-06-21 ENCOUNTER — Encounter (INDEPENDENT_AMBULATORY_CARE_PROVIDER_SITE_OTHER): Payer: Self-pay | Admitting: Family Medicine

## 2014-06-21 ENCOUNTER — Ambulatory Visit (INDEPENDENT_AMBULATORY_CARE_PROVIDER_SITE_OTHER): Payer: BC Managed Care – PPO | Admitting: Family Medicine

## 2014-06-21 VITALS — BP 159/89 | HR 102 | Temp 98.3°F | Wt 197.0 lb

## 2014-06-21 DIAGNOSIS — Z0000 Encounter for general adult medical examination without abnormal findings: Secondary | ICD-10-CM

## 2014-06-21 DIAGNOSIS — J0110 Acute frontal sinusitis, unspecified: Secondary | ICD-10-CM

## 2014-06-21 MED ORDER — OXYMETAZOLINE HCL 0.05 % NA SOLN
2.0000 | Freq: Two times a day (BID) | NASAL | Status: DC
Start: 2014-06-21 — End: 2015-03-01

## 2014-06-21 MED ORDER — AMOXICILLIN-POT CLAVULANATE 875-125 MG OR TABS
1.0000 | ORAL_TABLET | Freq: Two times a day (BID) | ORAL | Status: DC
Start: 2014-06-21 — End: 2015-03-01

## 2014-06-21 NOTE — Progress Notes (Signed)
Family Medicine Clinic Resident SOAP Note    CC:   Cough    Subjective/HPI:   Derrick Werner is a 54 year old male who presents for:    1. Sinus pressure and malaise with subj fevers x 3 weeks. Bloody and purulent nasal DC. Feels facial pressure. Dry hacking cough. No SOB or DOE. No flu vaccines ever.     Past medical, Surgical and family history reviewed and updated on histories tab if indicated.       ROS:  Per HPI    Patient Active Problem List   Diagnosis    Actinic keratosis    Hypertensive disorder    Hyperlipidemia    GERD (gastroesophageal reflux disease)    Sciatica    Chronic lower back pain    Paresthesia of lower extremity    Ganglion cyst of wrist    Sciatica of left side    Mass    Hypertension    Tobacco use     Outpatient Prescriptions Marked as Taking for the 06/21/14 encounter (Office Visit) with Miachel Roux, MD   Medication Sig Dispense Refill    amoxicillin-clavulanate (AUGMENTIN) 875-125 MG tablet Take 1 tablet by mouth 2 times daily. 20 tablet 0    aspirin 81 MG EC tablet Take 1 tablet by mouth daily. 1000 tablet 2    hydrochlorothiazide (HYDRODIURIL) 25 MG tablet Take 1 tablet by mouth daily. 90 tablet 1    ibuprofen (MOTRIN) 800 MG tablet Take 800 mg by mouth every 6 hours as needed for Mild Pain (Pain Score 1-3).      lisinopril (PRINIVIL, ZESTRIL) 10 MG tablet Take 1 tablet by mouth daily. 90 tablet 1    Omega-3 Fatty Acids (FISH OIL) 500 MG capsule Take 2 capsules by mouth 2 times daily. 120 capsule 6    omeprazole (PRILOSEC OTC) 20 MG Enteric-Coated tablet Take 20 mg by mouth daily. 90 tablet 3    oxymetazoline (AFRIN) 0.05 % nasal spray Spray 2 sprays into each nostril 2 times daily. Recommend limiting use to three days only 1 bottle 0    simvastatin (ZOCOR) 40 MG tablet Take 1 tablet by mouth every evening. 90 tablet 3     Immunization History   Administered Date(s) Administered    Influenza Vaccine >=3 Years 03/17/2009    Tdap 09/30/2006     No  Known Allergies  Health Maintenance   Topic Date Due    COLON CANCER SCREENING WITH COLONOSCOPY  09/04/2008    INFLUENZA VACCINE  12/12/2014    IMM_TD/TDAP=>9 YO  09/29/2016       Objective:  Filed Vitals:    06/21/14 1413   BP: 159/89   Pulse: 102   Temp: 98.3 F (36.8 C)   TempSrc: Oral   Weight: 89.359 kg (197 lb)   SpO2: 98%     Estimated body mass index is 28.27 kg/(m^2) as calculated from the following:    Height as of 10/12/12: _0  (1.778 m).    Weight as of this encounter: 89.359 kg (197 lb).    Physical Examination:    General: AAOx, in no acute distress   HEENT: NCAT, EOMI, normal thyroid, MMM,+ frontal sinus TTP. Nasal turbinates erythematous with purulent DC.   CV: RRR no murmur's  Lungs: CTAB, no wheezing or rhonchi. Good air movement    Encounter Results/Procedure Note:  N/a     Assessment and Plan:   Derrick Werner is a 54 year old male with:  1. Acute frontal sinusitis, recurrence not specified  Bacterial following viral URI. Will treat with abx. Lavage discussed. follow up prn.   - amoxicillin-clavulanate (AUGMENTIN) 875-125 MG tablet; Take 1 tablet by mouth 2 times daily.  Dispense: 20 tablet; Refill: 0  - oxymetazoline (AFRIN) 0.05 % nasal spray; Spray 2 sprays into each nostril 2 times daily. Recommend limiting use to three days only  Dispense: 1 bottle; Refill: 0    2. Health care maintenance  - GI Endoscopy Procedure Service Request (Non-GI Use Only); Future    Return if symptoms worsen or fail to improve.  Orders Placed This Encounter   Procedures    GI Endoscopy Procedure Service Request (Non-GI Use Only)     __________________  Daylene Katayama MD  Family Medicine, PGY-3  [phone]    Patient discussed with Attending Dr. Steve Rattler    Patient Instructions   Please stay hydrated to keep your mucous thin. You can purchase over the counter decongestant sprays, such as oxymetazoline and follow the bottle instructions. Only use it for 3 days maximum. If you were prescribed antibiotics, take  them for the entire course even if you feel better sooner.     You may also purchase a sinus lavage kit, such as NeilMed Sinus rinse. Read and follow the instructions.           Medications reviewed with patient and medication list reconciled.   Over the counter medications, herbal therapies and supplements reviewed.   Patient's understanding and response to medications assessed.   Barriers to medications assessed and addressed.   Risks, benefits, alternatives to medications reviewed.   Barriers to Learning assessed: none. Patient verbalizes understanding of teaching and instructions.

## 2014-06-21 NOTE — Patient Instructions (Signed)
Please stay hydrated to keep your mucous thin. You can purchase over the counter decongestant sprays, such as oxymetazoline and follow the bottle instructions. Only use it for 3 days maximum. If you were prescribed antibiotics, take them for the entire course even if you feel better sooner.     You may also purchase a sinus lavage kit, such as NeilMed Sinus rinse. Read and follow the instructions.

## 2014-06-21 NOTE — Interdisciplinary (Signed)
Pre-visit chart review and huddle completed with staff and physician.    Outstanding labs, imaging and consults reviewed and identified.    Health maintanence issues identified and addressed:    Health Maintenance   Topic Date Due    COLON CANCER SCREENING WITH COLONOSCOPY  09/04/2008    INFLUENZA VACCINE  12/12/2014    IMM_TD/TDAP=>54 YO  09/29/2016

## 2014-06-23 ENCOUNTER — Telehealth (INDEPENDENT_AMBULATORY_CARE_PROVIDER_SITE_OTHER): Payer: Self-pay | Admitting: Family Medicine

## 2014-06-23 NOTE — Telephone Encounter (Signed)
Left message for patient to schedule an appointment with GI.     Please offer the [facility]. Thank you.

## 2014-06-27 NOTE — Progress Notes (Signed)
ATTENDING NOTE:                                                  SUBJECTIVE:   I discussed the case with the resident physician  at the time of the clinical session.  I reviewed the entire history.  HISTORY OF PRESENT ILLNESS  Derrick Werner is a 54 year old male with chief complaint    1. Sinus pressure x3 weeks   OBJECTIVE  Blood pressure 159/89, pulse 102, temperature 98.3 F (36.8 C), temperature source Oral, weight 89.359 kg (197 lb), SpO2 98 %.      I reviewed the resident's examination findings.    The test results were reviewed with the resident.  MEDICAL CARE PLAN:   Assessment and plan reviewed, discussed and finalized with the resident physician .     I agree with the resident physcian's plan as documented.  See the resident physician 's note for further details  No future appointments.  Elby Showers, D.O.  Signature Derived From Controlled Access Password, June 27, 2014, 8:39 PM

## 2014-08-02 NOTE — Telephone Encounter (Signed)
Left second message for patient to schedule an appointment with GI.

## 2014-08-23 ENCOUNTER — Other Ambulatory Visit (INDEPENDENT_AMBULATORY_CARE_PROVIDER_SITE_OTHER): Payer: Self-pay | Admitting: Family Medicine

## 2014-08-23 DIAGNOSIS — I10 Essential (primary) hypertension: Principal | ICD-10-CM

## 2014-08-23 MED ORDER — HYDROCHLOROTHIAZIDE 25 MG OR TABS
ORAL_TABLET | ORAL | Status: DC
Start: 2014-08-23 — End: 2015-03-01

## 2014-08-23 MED ORDER — LISINOPRIL 10 MG OR TABS
ORAL_TABLET | ORAL | Status: DC
Start: 2014-08-23 — End: 2015-03-01

## 2014-08-23 MED ORDER — SIMVASTATIN 40 MG OR TABS
ORAL_TABLET | ORAL | Status: DC
Start: 2014-08-23 — End: 2015-03-01

## 2014-08-23 NOTE — Telephone Encounter (Signed)
Medication: hydrochlorothiazide 25 mg, lisinopril 10 mg, simvastatin 40 mg  Pharmacy: [facility]

## 2014-09-16 ENCOUNTER — Encounter (INDEPENDENT_AMBULATORY_CARE_PROVIDER_SITE_OTHER): Payer: BC Managed Care – PPO | Admitting: Family Medicine

## 2015-02-28 ENCOUNTER — Ambulatory Visit (INDEPENDENT_AMBULATORY_CARE_PROVIDER_SITE_OTHER): Payer: BC Managed Care – PPO | Admitting: Internal Medicine

## 2015-03-01 ENCOUNTER — Encounter (INDEPENDENT_AMBULATORY_CARE_PROVIDER_SITE_OTHER): Payer: Self-pay | Admitting: Internal Medicine

## 2015-03-01 ENCOUNTER — Ambulatory Visit (INDEPENDENT_AMBULATORY_CARE_PROVIDER_SITE_OTHER): Payer: BC Managed Care – PPO | Admitting: Internal Medicine

## 2015-03-01 VITALS — BP 128/92 | HR 72 | Temp 98.3°F | Ht 70.0 in | Wt 191.0 lb

## 2015-03-01 DIAGNOSIS — E7800 Pure hypercholesterolemia, unspecified: Secondary | ICD-10-CM

## 2015-03-01 DIAGNOSIS — A09 Infectious gastroenteritis and colitis, unspecified: Principal | ICD-10-CM

## 2015-03-01 DIAGNOSIS — I10 Essential (primary) hypertension: Secondary | ICD-10-CM

## 2015-03-01 MED ORDER — ASPIRIN-ACETAMINOPHEN-CAFFEINE 250-250-65 MG OR TABS: 1.00 | ORAL_TABLET | Freq: Four times a day (QID) | ORAL | Status: AC | PRN

## 2015-03-01 NOTE — Patient Instructions (Addendum)
HOME BPS BRING TO NEXT VISIT, ABOUT ONE PER DAY  WELLNESS EXAM NEXT VISIT  STOOL EVALUATION FOR INFECTION  FOR NOW, HIGH FIBER DIET, INCLUDING WHOLE GRAINS, MAY SLOW DIARRHEA  ALSO CAN USE IMMODIUM BUT ONLY ONCE PER DAY  STAY HYDRATED WHILE WE DO THE EVALUATION.   YOU NEED A COLONOSCOPY FOR CANCER SCREENING AND IT MAY BE NEEDED FOR THIS EVAL OF DIARRHEA    FAX  [phone]

## 2015-03-01 NOTE — Progress Notes (Signed)
Derrick Werner is a 54 year old male who presents for   Chief Complaint   Patient presents with    Establish Care       History of Present Illness: He is here to establish care and for a new problem.  He For 2 weeks, he has intermitant diarrhea, Stools are watery, nl volume, no blood, sometimes cramps.   It can awaken him at night. Wife has similar but less intense. No recent use abt or travel. His dogs were recently dx with clostridium perferinges infection with diarrhea, tx with an antibiotic for 2 weeks with success.   He now has sore area, bump near anus, feels hard. No blood or dc, but raw and sensitive, may be decreasing in size.    He has been treated for High Blood Pressure and Hyperlipidemia, but he had side effects of muscle aches, fatigue, and poor sense of well being. He stopped them and felt much better. He does not do home BP's. He has an actve job as Games developer, and has no cardio-vascular symptoms.     WELLNESS;    COLONOSCOPY-NEVER  FLUVAX-DECLINED  CPE> 1 YEAR AGO    Patient Active Problem List   Diagnosis    Actinic keratosis    Hyperlipidemia    GERD (gastroesophageal reflux disease)    Sciatica of left side    Hypertension    Tobacco use       Past Medical History   Diagnosis Date    Actinic keratosis 10/23/2009    GERD (gastroesophageal reflux disease)     Hyperlipidemia     Hypertension 10/24/2009    Mass      Let wrist mass    Sciatica of left side      MOST UPDATED LABS:  Clinic Lab on 05/28/2013   Component Date Value Ref Range Status    WBC 05/28/2013 7.4  4.0 - 10.0 1000/mm3 Final    RBC 05/28/2013 4.86  4.60 - 6.10 mill/mm3 Final    Hgb 05/28/2013 15.6  13.7 - 17.5 gm/dL Final    Hct 05/28/2013 45.1  40.0 - 50.0 % Final    MCV 05/28/2013 92.8  79.0 - 95.0 um3 Final    MCH 05/28/2013 32.1* 26.0 - 32.0 pgm Final    MCHC 05/28/2013 34.6  32.0 - 36.0 % Final    RDW 05/28/2013 12.9  12.0 - 14.0 % Final    MPV 05/28/2013 9.7  9.4 - 12.4 fL Final    Plt Count  05/28/2013 255  140 - 370 1000/mm3 Final    Segs 05/28/2013 50  34 - 71 % Final    Lymphocytes 05/28/2013 38  19 - 53 % Final    Monocytes 05/28/2013 8  5 - 12 % Final    Eosinophils 05/28/2013 3  <1 - 4 % Final    Basophils 05/28/2013 1  0 - 2 % Final    ANC-Automated 05/28/2013 3.7  1.6 - 7.0 1000/mm3 Final    Abs Lymphs 05/28/2013 2.8  0.8 - 3.1 1000/mm3 Final    Abs Monos 05/28/2013 0.6  0.2 - 0.8 1000/mm3 Final    Abs Eosinophils 05/28/2013 0.3  <0.1 - 0.5 1000/mm3 Final    Diff Type 05/28/2013 Automated   Final    Iron 05/28/2013 86  59 - 158 mcg/dL Final    UIBC 05/28/2013 239  112 - 346 mcg/dL Final    Comment: This UIBC and Percent Iron Saturation may not be  accurate in patients receiving Iron Therapy or                           in Renal Failure.    Total IBC 05/28/2013 325  148 - 506 mcg/dL Final    Iron Saturation 05/28/2013 26  % Final    Ferritin 05/28/2013 131  30 - 400 ng/mL Final    Microalbumin, Urine 05/28/2013 <1.2  <2.0 mg/dL Final    Creatinine, Urine 05/28/2013 90  40 - 278 mg/dL Final    MALB/CR Ratio Random 05/28/2013 see below  0 - 30 mcg/mgCr Final    Unable to Calculate       Past Surgical History   Procedure Laterality Date    S/p left thumb reconstruction  ~1992 x 6 surgeries     with a great toe wraparound flap approximately 20 years ago     Wisdom teeth extractions         Current Outpatient Prescriptions   Medication Sig Dispense Refill    aspirin-acetaminophen-caffeine (EXCEDRIN) 250-250-65 MG tablet Take 1 tablet by mouth every 6 hours as needed for Mild Pain (Pain Score 1-3).      ibuprofen (MOTRIN) 800 MG tablet Take 800 mg by mouth every 6 hours as needed for Mild Pain (Pain Score 1-3).       No current facility-administered medications for this visit.        No Known Allergies    Social History     Social History    Marital status: Married     Spouse name: ALARIE    Number of children: 2    Years of education: N/A          Occupational History    retired; Museum/gallery exhibitions officer      Social History Main Topics    Smoking status: Former Smoker     Packs/day: 0.50     Years: 10.00     Types: Cigarettes    Smokeless tobacco: Former Systems developer     Quit date: 2010      Comment: Currently smokes electronic cigarettes: Vapor: Fills it twice weekly. Using this since ~04/2012    Alcohol use 1.8 - 16.8 oz/week     0 - 3 Glasses of wine, 3 Standard drinks or equivalent per week    Drug use: 1.00 per week     Special: Marijuana    Sexual activity: Yes     Other Topics Concern    Special Diet No    Exercise Yes     Social History Narrative    MARRIED Dierdre Harness)       Family History   Problem Relation Age of Onset    Cancer Mother     Heart Disease Father                            Review Of Systems  Review of Systems   Constitutional: Negative for activity change, appetite change, chills, diaphoresis, fatigue, fever and unexpected weight change.   HENT:        Sore, ulcer in lower right gum line, resolving.    Respiratory: Negative for chest tightness and shortness of breath.    Cardiovascular: Negative for chest pain, palpitations and leg swelling.   Gastrointestinal:             Skin: Negative for rash.       PHYSICAL  EXAMINATION:  BP (!) 128/92  Pulse 72  Temp 98.3 F (36.8 C) (Oral)  Ht '5\' 10"'  (1.778 m)  Wt 86.6 kg (191 lb)  BMI 27.41 kg/m2    Physical Exam   Constitutional: He is oriented to person, place, and time. He appears well-developed and well-nourished. No distress.   Eyes: Conjunctivae are normal.   Cardiovascular: Normal rate, regular rhythm, normal heart sounds and intact distal pulses.    Pulmonary/Chest: Breath sounds normal.   Abdominal: Soft. Bowel sounds are normal. He exhibits no distension. There is no tenderness.   Neurological: He is alert and oriented to person, place, and time.   Skin: No rash noted. No pallor.   Psychiatric: He has a normal mood and affect.       ASSESSMENT/PLAN:    ICD-10-CM ICD-9-CM    1.  Diarrhea of infectious origin A09 009.3 CBC WITH ADIFF, BLOOD      STOOL CULTURE      SMEAR, WBC      C. DIFFICILE TOXINS A+B, EIA-[facility] OR [facility]      STANDARD O&P      CBC WITH ADIFF, BLOOD      SMEAR, WBC      C. DIFFICILE TOXINS A+B, EIA-[facility]      STANDARD O&P   2. Pure hypercholesterolemia E78.00 272.0    3. Essential hypertension I10 401.9 MICROALB/CREAT RATIO, RANDM UR -[facility]      CMP14+EGFR -[facility]      LIPID PANEL W/ LDL DIRECT -[facility]      MICROALB/CREAT RATIO, RANDM UR -[facility]      CMP14+EGFR -[facility]      LIPID PANEL W/ LDL DIRECT -[facility]     THE DIARRHEA ETIOLOGY IS NOT KNOWN, BUT INFECTIOUS WORKUP IS ORDERED  . I DON'T KNOW IF THE CLOSTRIDIUM PERFERINGES INFECTION OF THE DOG CAN CAUSE HIS SX ( THIS IS NOT C. DIFF ).  FOR NOW HE CAN USE IMMODIUM ONCE PER DAY IF NEEDED AND STAY HYDRATED. DIET SHOULD BE HIGHER IN WHOLE GRAIN FIBER.   HE MAY NEED COLONOSCOPY FOR SCREENING AND FOR THIS EVALUATION.  HIGH BLOOD PRESSURE- HOME BP'S,BRING TO NEXT VISIT, OR IF PERSISTANTLY HIGHER THAN 160/95 WE CAN MAKE DECISION EARLIER.  CHECKING LIPIDS, RISK FACTORS OF MALE, AGE 38 TOBACCO PLACE FACTOR INTO TREATMENT  TOBACCO USE-WE DISCUSSED BARRIERS TO STOPPING

## 2015-03-02 LAB — CBC WITH DIFF, BLOOD
Baso (Absolute): 0 10*3/uL (ref 0.0–0.2)
Basos: 0 %
Eos (Absolute): 0.1 10*3/uL (ref 0.0–0.4)
Eos: 2 %
Hematocrit: 47.5 % (ref 37.5–51.0)
Hemoglobin: 16.4 g/dL (ref 12.6–17.7)
Immature Grans (Abs): 0 10*3/uL (ref 0.0–0.1)
Immature Granulocytes: 0 %
Lymphs (Absolute): 2.9 10*3/uL (ref 0.7–3.1)
Lymphs: 42 %
MCH: 31.4 pg (ref 26.6–33.0)
MCHC: 34.5 g/dL (ref 31.5–35.7)
MCV: 91 fL (ref 79–97)
Monocytes(Absolute): 0.4 10*3/uL (ref 0.1–0.9)
Monocytes: 6 %
Neutrophils (Absolute): 3.5 10*3/uL (ref 1.4–7.0)
Neutrophils: 50 %
Platelets: 250 10*3/uL (ref 150–379)
RBC: 5.23 x10E6/uL (ref 4.14–5.80)
RDW: 13.6 % (ref 12.3–15.4)
WBC: 6.9 10*3/uL (ref 3.4–10.8)

## 2015-03-02 LAB — LIPID PANEL W/LDL DIRECT - LABCORP
Cholesterol: 230 mg/dL — ABNORMAL HIGH (ref 100–199)
HDL Cholesterol: 38 mg/dL — ABNORMAL LOW (ref >39)
LDL Chol. (Direct): 170 mg/dL — ABNORMAL HIGH (ref 0–99)
LDL Cholesterol Calc: 146 mg/dL — ABNORMAL HIGH (ref 0–99)
Triglycerides: 230 mg/dL — ABNORMAL HIGH (ref 0–149)
VLDL Cholesterol Cal: 46 mg/dL — ABNORMAL HIGH (ref 5–40)

## 2015-03-02 LAB — CMP14 AND EGFR - LABCORP
A/G Ratio: 2.1 (ref 1.1–2.5)
ALT (SGPT): 23 IU/L (ref 0–44)
AST: 23 IU/L (ref 0–40)
Albumin: 4.7 g/dL (ref 3.5–5.5)
Alkaline Phos: 63 IU/L (ref 39–117)
BUN/Creatinine Ratio: 14 (ref 9–20)
BUN: 13 mg/dL (ref 6–24)
Bilirubin, Total: 0.6 mg/dL (ref 0.0–1.2)
Calcium: 10 mg/dL (ref 8.7–10.2)
Carbon Dioxide: 24 mmol/L (ref 18–29)
Chloride: 104 mmol/L (ref 97–106)
Creatinine: 0.94 mg/dL (ref 0.76–1.27)
Globulin, Total: 2.2 g/dL (ref 1.5–4.5)
Glucose: 83 mg/dL (ref 65–99)
Potassium: 5.5 mmol/L — ABNORMAL HIGH (ref 3.5–5.2)
Protein, Total, Serum: 6.9 g/dL (ref 6.0–8.5)
Sodium: 145 mmol/L — ABNORMAL HIGH (ref 136–144)
eGFR If Africn Am: 104 mL/min/{1.73_m2} (ref >59)
eGFR If NonAfricn Am: 90 mL/min/{1.73_m2} (ref >59)

## 2015-03-02 LAB — MICROALB/CREAT RATIO, RANDM UR -LABCORP

## 2015-03-02 LAB — SMEAR, WBC

## 2015-03-02 LAB — STANDARD O&P

## 2015-03-02 LAB — C. DIFFICILE TOXINS A+B, EIA-CONSOLIDATED

## 2015-03-04 ENCOUNTER — Other Ambulatory Visit (INDEPENDENT_AMBULATORY_CARE_PROVIDER_SITE_OTHER): Payer: Self-pay | Admitting: Internal Medicine

## 2015-03-06 ENCOUNTER — Other Ambulatory Visit (INDEPENDENT_AMBULATORY_CARE_PROVIDER_SITE_OTHER): Payer: BC Managed Care – PPO | Attending: Internal Medicine

## 2015-03-07 LAB — MICROALB/CREAT RATIO, RANDM UR -LABCORP
Creatinine, Urine: 89.7 mg/dL
Microalb/Creat Ratio: 6.7 mg/g creat (ref 0.0–30.0)
Microalbumin, Urine: 6 ug/mL

## 2015-03-08 ENCOUNTER — Telehealth (INDEPENDENT_AMBULATORY_CARE_PROVIDER_SITE_OTHER): Payer: Self-pay | Admitting: Internal Medicine

## 2015-03-08 NOTE — Telephone Encounter (Signed)
Pts wife lvm asking where the pt needs to schedule his Cn and prostate tests.    I called and adv the pts wife that Dr Clovis Riley is not ordering the CN until he reviews the results of the stool studies. That will determine if CN is even needed. And in regards to the prostate test that is done as part of his yearly physical which is scheduled in Dec

## 2015-03-09 LAB — C. DIFFICILE TOXINS A+B, EIA-CONSOLIDATED: C difficile Toxins A+B, EIA: NEGATIVE

## 2015-03-10 ENCOUNTER — Telehealth (INDEPENDENT_AMBULATORY_CARE_PROVIDER_SITE_OTHER): Payer: Self-pay | Admitting: Internal Medicine

## 2015-03-10 NOTE — Telephone Encounter (Signed)
C. DIFF TOXIN NEGATIVE  AT THIS POINT , IF DIARRHEA IS PERSISTENT , GI CONSULT IS INDICATED TO EVAL FOR COLONOSCOPY.  I NEVER RECEIVED THE VET REPORT ON THE INFECTION HIS DOG HAD.  I DON'T HAVE THE OTHER STOOL RESULTS YET- STOOL WBC, O AND P, AND I DON'T EVEN SEE A ROUTINE STOOL CULTURE ORDERED.   AT THIS POINT, NO FURTHER SAMPLE COLLECTION, WE WILL JUST SENT TO GI.

## 2015-03-13 LAB — STOOL CULTURE: E coli Shiga Toxin EIA: NEGATIVE

## 2015-03-13 LAB — SPECIMEN STATUS REPORT-LABCORP

## 2015-03-15 LAB — STANDARD O&P

## 2015-03-15 LAB — SMEAR, WBC

## 2015-03-16 ENCOUNTER — Telehealth (INDEPENDENT_AMBULATORY_CARE_PROVIDER_SITE_OTHER): Payer: Self-pay | Admitting: Internal Medicine

## 2015-03-16 NOTE — Telephone Encounter (Signed)
LMOM  To inform of stool results and Dr Clovis Riley also wanting to know how he was doing. Told him to call back or he can send me a my chart message. Need to know if his sxs improved if not will need GI consult

## 2015-04-28 ENCOUNTER — Encounter (INDEPENDENT_AMBULATORY_CARE_PROVIDER_SITE_OTHER): Payer: BC Managed Care – PPO | Admitting: Internal Medicine

## 2015-05-02 ENCOUNTER — Ambulatory Visit (INDEPENDENT_AMBULATORY_CARE_PROVIDER_SITE_OTHER): Payer: BLUE CROSS/BLUE SHIELD | Admitting: Internal Medicine

## 2015-05-02 VITALS — BP 126/86 | HR 64 | Temp 97.9°F | Ht 68.0 in | Wt 198.0 lb

## 2015-05-02 DIAGNOSIS — M10071 Idiopathic gout, right ankle and foot: Secondary | ICD-10-CM

## 2015-05-02 DIAGNOSIS — L570 Actinic keratosis: Secondary | ICD-10-CM

## 2015-05-02 DIAGNOSIS — Z0000 Encounter for general adult medical examination without abnormal findings: Principal | ICD-10-CM

## 2015-05-02 DIAGNOSIS — Z125 Encounter for screening for malignant neoplasm of prostate: Secondary | ICD-10-CM

## 2015-05-02 DIAGNOSIS — I10 Essential (primary) hypertension: Secondary | ICD-10-CM

## 2015-05-02 DIAGNOSIS — M109 Gout, unspecified: Secondary | ICD-10-CM

## 2015-05-02 DIAGNOSIS — E7800 Pure hypercholesterolemia, unspecified: Secondary | ICD-10-CM

## 2015-05-02 DIAGNOSIS — E739 Lactose intolerance, unspecified: Secondary | ICD-10-CM

## 2015-05-02 MED ORDER — COLCHICINE 0.6 MG OR TABS
ORAL_TABLET | ORAL | 1 refills | Status: DC
Start: 2015-05-02 — End: 2015-05-02

## 2015-05-02 MED ORDER — COLCHICINE 0.6 MG OR TABS
ORAL_TABLET | ORAL | 1 refills | Status: DC
Start: 2015-05-02 — End: 2015-05-17

## 2015-05-02 MED ORDER — ATORVASTATIN CALCIUM 40 MG OR TABS
40.00 mg | ORAL_TABLET | Freq: Every day | ORAL | 6 refills | Status: AC
Start: 2015-05-02 — End: ?

## 2015-05-02 NOTE — Interdisciplinary (Signed)
Order faxed to Dr Quintella Baton for direct colonoscopy. Patient will call and schedule his own appt

## 2015-05-02 NOTE — Progress Notes (Signed)
Derrick Werner is a 54 year old male who presents for   Chief Complaint   Patient presents with    Wellness Visit       History of Present Illness:    SINCE OUR LAST THE DIARRHEA RESOLVED, BUT HE KNOWS TOO MUCH DAIRY AT ONCE HE GETS BLOATING AND LOOSE STOOLS. HOT SPICY FOODS ALSO TRIGGER LOOSE STOOLS.   RIGHT> LEFT FOOT SWELLING, SWELL BY END OF DAY. NOT NEW OVER SEVERAL YEARS, HE HAS BEEN ON WATER PILL IN PAST FOR BP AND HE DID NOT HAVE THE SWELLING.   HE HAS BEEN OFF BP MEDS FOR ABOUT 10 MONTHS FEELS MUCH BETTER ENERGY.  HE DOES NOT HAVE HX OF GOUT, BUT HAS HAD 2 EPISODES OF PAIN, SWELLING, HOT, RED LATERAL RIGHT FOOT, ACTIVE ALL WEEK.  DIET-HE DRINKS COLAS, ALCOHOL 2 DRINKS, + BEEF + SHELLFISH, NO FH GOUT. NO HX RIGHT ANKLE INJURY.   CHOLESTEROL ELEVATION- NON SMOKER, FH CAD FATHER CABG AGE 35, + HX HTN IN HIMSELF TX IN PAST; NO DIABETES. OFF E TOBACCO     Immunization History   Administered Date(s) Administered    Influenza Vaccine >=3 Years 03/17/2009    Tdap 09/30/2006     FLUVAX DECLINED    Patient Active Problem List   Diagnosis    Actinic keratosis    Hyperlipidemia    GERD (gastroesophageal reflux disease)    Hypertension    Acute idiopathic gout of right foot    Lactose intolerance in adult    Wellness examination       Past Medical History   Diagnosis Date    Actinic keratosis 10/23/2009    GERD (gastroesophageal reflux disease)     Hyperlipidemia     Hypertension 10/24/2009    Mass      Let wrist mass    Sciatica of left side        Office Visit on 03/01/2015   Component Date Value Ref Range Status    WBC 03/01/2015 6.9  3.4 - 10.8 x10E3/uL Final    RBC 03/01/2015 5.23  4.14 - 5.80 x10E6/uL Final    Hemoglobin 03/01/2015 16.4  12.6 - 17.7 g/dL Final    Hematocrit 03/01/2015 47.5  37.5 - 51.0 % Final    MCV 03/01/2015 91  79 - 97 fL Final    MCH 03/01/2015 31.4  26.6 - 33.0 pg Final    MCHC 03/01/2015 34.5  31.5 - 35.7 g/dL Final    RDW 03/01/2015 13.6  12.3 - 15.4 % Final       Platelets 03/01/2015 250  150 - 379 x10E3/uL Final    Neutrophils 03/01/2015 50  % Final    Lymphs 03/01/2015 42  % Final    Monocytes 03/01/2015 6  % Final    Eos 03/01/2015 2  % Final    Basos 03/01/2015 0  % Final    Neutrophils (Absolute) 03/01/2015 3.5  1.4 - 7.0 x10E3/uL Final    Lymphs (Absolute) 03/01/2015 2.9  0.7 - 3.1 x10E3/uL Final    Monocytes(Absolute) 03/01/2015 0.4  0.1 - 0.9 x10E3/uL Final    Eos (Absolute) 03/01/2015 0.1  0.0 - 0.4 x10E3/uL Final    Baso (Absolute) 03/01/2015 0.0  0.0 - 0.2 x10E3/uL Final    Immature Granulocytes 03/01/2015 0  % Final    Immature Grans (Abs) 03/01/2015 0.0  0.0 - 0.1 x10E3/uL Final    Creatinine, Urine 03/01/2015 CANCELED  mg/dL Final-Edited    Comment: No urine specimen  received.  03/02/2015-Yang    Result canceled by the ancillary      Microalbumin, Urine 03/01/2015 CANCELED   Final-Edited    Comment: Test not performed    Result canceled by the ancillary      Glucose 03/01/2015 83  65 - 99 mg/dL Final    BUN 03/01/2015 13  6 - 24 mg/dL Final    Creatinine 03/01/2015 0.94  0.76 - 1.27 mg/dL Final    eGFR If NonAfricn Am 03/01/2015 90  >59 mL/min/1.73 Final    eGFR If Africn Am 03/01/2015 104  >59 mL/min/1.73 Final    BUN/Creatinine Ratio 03/01/2015 14  9 - 20 Final    Sodium 03/01/2015 145* 136 - 144 mmol/L Final                  **Please note reference interval change**    Potassium 03/01/2015 5.5* 3.5 - 5.2 mmol/L Final                  **Please note reference interval change**    Chloride 03/01/2015 104  97 - 106 mmol/L Final                  **Please note reference interval change**    Carbon Dioxide 03/01/2015 24  18 - 29 mmol/L Final    Calcium 03/01/2015 10.0  8.7 - 10.2 mg/dL Final    Protein, Total, Serum 03/01/2015 6.9  6.0 - 8.5 g/dL Final    Albumin 03/01/2015 4.7  3.5 - 5.5 g/dL Final    Globulin, Total 03/01/2015 2.2  1.5 - 4.5 g/dL Final    A/G Ratio 03/01/2015 2.1  1.1 - 2.5 Final    Bilirubin, Total 03/01/2015  0.6  0.0 - 1.2 mg/dL Final    Alkaline Phos 03/01/2015 63  39 - 117 IU/L Final    AST 03/01/2015 23  0 - 40 IU/L Final    ALT (SGPT) 03/01/2015 23  0 - 44 IU/L Final    Cholesterol 03/01/2015 230* 100 - 199 mg/dL Final    Triglycerides 03/01/2015 230* 0 - 149 mg/dL Final    HDL Cholesterol 03/01/2015 38* >39 mg/dL Final    Comment: According to ATP-III Guidelines, HDL-C >59 mg/dL is considered a  negative risk factor for CHD.      VLDL Cholesterol Cal 03/01/2015 46* 5 - 40 mg/dL Final    LDL Cholesterol Calc 03/01/2015 146* 0 - 99 mg/dL Final    LDL Chol. (Direct) 03/01/2015 170* 0 - 99 mg/dL Final    White Blood Cells (WBC), Stool 03/01/2015 CANCELED   Final-Edited    Comment: No Ova / Parasite transport container received.  No frozen specimen received.  03/02/2015-Merrill  Performed At: [address]  [facility]  [address]  [place], [place]  [id number]  Orene Desanctis R MD  [id number]    Result canceled by the ancillary      C difficile Toxins A+B, EIA 03/01/2015 CANCELED   Final-Edited    Comment: No Ova / Parasite transport container received.  No frozen specimen received.  03/02/2015-Merrill    Result canceled by the ancillary      Ova + Parasite Exam 03/01/2015 CANCELED   Final-Edited    Comment: No Ova / Parasite transport container received.  No frozen specimen received.  03/02/2015-Merrill  These results were obtained using wet preparation(s) and trichrome  stained smear. This test does not include testing for Cryptosporidium  parvum,  Cyclospora, or Microsporidia.    Result canceled by the ancillary      Creatinine, Urine 03/06/2015 89.7  Not Estab. mg/dL Final    Microalbumin, Urine 03/06/2015 6.0  Not Estab. ug/mL Final    Microalb/Creat Ratio 03/06/2015 6.7  0.0 - 30.0 mg/g creat Final    Comment: Performed At: [address]  [facility]  [address]  [place], [place]  [id number]  Shawn Stall MD  [id number]      C difficile Toxins A+B, EIA 03/06/2015  Negative  Negative Final    Comment: Performed At: [facility]  [address]  [place], [place]  [id number]  Shawn Stall MD  [id number]      Gruver Screen 03/06/2015 Final report   Final    Result 1 03/06/2015 Comment   Final    No Salmonella or Shigella recovered.    Campylobacter Culture 03/06/2015 Final report   Final    Result 1 03/06/2015 Comment   Final    No Campylobacter species isolated.    E coli Shiga Toxin EIA 03/06/2015 Negative  Negative Final    Status Message 03/06/2015 Comment   Final    Comment: Written Authorization  Written Authorization  Written Authorization Received.  Authorization received from [facility] 03-09-2015  Logged by Sallee Lange  Performed At: [address]  [address]  [place], [place]  [id number]  Shawn Stall MD  [id number]      Ova + Parasite Exam 03/06/2015 Final report   Final    Comment: These results were obtained using wet preparation(s) and trichrome  stained smear. This test does not include testing for Cryptosporidium  parvum, Cyclospora, or Microsporidia.      Ova & Parasites Result 03/06/2015 Comment   Final    No ova, cysts, or parasites seen.    White Blood Cells (WBC), Stool 03/06/2015 Final report  None Seen Final    WBC Result 03/06/2015 Comment   Final    Comment: No white blood cells seen.  Performed At: [address]  [facility]  [address]  [place], [place]  [id number]  Orene Desanctis R MD  [id number]         Past Surgical History   Procedure Laterality Date    S/p left thumb reconstruction  ~1992 x 6 surgeries     with a great toe wraparound flap approximately 20 years ago     Wisdom teeth extractions         Current Outpatient Prescriptions   Medication Sig Dispense Refill    aspirin-acetaminophen-caffeine (EXCEDRIN) 250-250-65 MG tablet Take 1 tablet by mouth every 6 hours as needed for Mild Pain (Pain Score 1-3).      atorvastatin (LIPITOR) 40  MG tablet Take 1 tablet (40 mg) by mouth daily. 30 tablet 6    colchicine (COLCRYS) 0.6 MG tablet Take two tablets at onset of symptoms and repeat one tab in one hour. Use as needed for flare ups 6 tablet 1    ibuprofen (MOTRIN) 800 MG tablet Take 800 mg by mouth every 6 hours as needed for Mild Pain (Pain Score 1-3).       No current facility-administered medications for this visit.        No Known Allergies    Social History  Social History    Marital status: Married     Spouse name: ALARIE    Number of children: 2    Years of education: N/A     Occupational History    retired; Museum/gallery exhibitions officer      Social History Main Topics    Smoking status: Former Smoker     Packs/day: 0.50     Years: 10.00     Types: Cigarettes    Smokeless tobacco: Former Systems developer     Quit date: 2010      Comment: Currently smokes electronic cigarettes: Vapor: Fills it twice weekly. Using this since ~04/2012    Alcohol use 1.8 - 16.8 oz/week     0 - 3 Glasses of wine, 3 Standard drinks or equivalent per week    Drug use: 1.00 per week     Special: Marijuana    Sexual activity: Yes     Other Topics Concern    Special Diet No    Exercise Yes     Social History Narrative    MARRIED Dierdre Harness)       Family History   Problem Relation Age of Onset    Cancer Mother     Heart Disease Father                            Review Of Systems  Review of Systems    PHYSICAL EXAMINATION:  BP 126/86  Pulse 64  Temp 97.9 F (36.6 C) (Oral)  Ht '5\' 8"'  (1.727 m)  Wt 89.8 kg (198 lb)  BMI 30.11 kg/m2    Physical Exam   Constitutional: He is oriented to person, place, and time. He appears well-developed and well-nourished. No distress.   HENT:   Head: Normocephalic and atraumatic.   Right Ear: External ear normal.   Left Ear: External ear normal.   Nose: Nose normal.   Mouth/Throat: Oropharynx is clear and moist.   Eyes: Conjunctivae and EOM are normal. Pupils are equal, round, and reactive to light.   Neck: Normal range of motion. Neck  supple. No JVD present. No thyromegaly present.   Cardiovascular: Normal rate, regular rhythm, normal heart sounds and intact distal pulses.  Exam reveals no gallop.    No murmur heard.  Pulmonary/Chest: Breath sounds normal. No respiratory distress. He has no wheezes. He has no rales.   Abdominal: Soft. Bowel sounds are normal. He exhibits no distension and no mass. There is no tenderness.   Genitourinary: Rectum normal, prostate normal and penis normal.   Musculoskeletal: Normal range of motion. He exhibits edema (1= bilat distal leg to ankle edema, not red, warm or tender).        Right ankle: He exhibits normal range of motion and no swelling. No tenderness.        Feet:     L=Tender, mild sts, no red or warmth     Lymphadenopathy:     He has no cervical adenopathy.   Neurological: He is alert and oriented to person, place, and time. No cranial nerve deficit.   Skin: Skin is warm and dry. No rash noted.   No abcde lesions   Psychiatric: He has a normal mood and affect. His behavior is normal. Judgment and thought content normal.   SKIN - AK LEFT EARLOBE, TX LN, FTF, GOOD RESULTS. SKIN PROTECTION REVIEWED.     ASSESSMENT/PLAN:    ICD-10-CM ICD-9-CM    1. Wellness  examination Z00.00 V70.0    2. Pure hypercholesterolemia E78.00 272.0 atorvastatin (LIPITOR) 40 MG tablet   3. Lactose intolerance in adult E73.9 271.3    4. Acute idiopathic gout of right foot M10.071 274.01 colchicine (COLCRYS) 0.6 MG tablet   5. Essential hypertension I10 401.9    6. Acute gout, unspecified cause, unspecified site M10.9 274.01    7. Actinic keratoses L57.0 702.0    8. Actinic keratosis L57.0 702.0        YOU HAVE LACTOSE INTOLERANCE, CAN GET LACTAID PILLS AT [facility] OR CUT BACK THE VOLUME AT ONE MEAL.   ELEVATED LIPIDS- GIVEN FAM HX, HX OF HIGH BLOOD PRESSURE AND PAST TOBACCO USE, YOUR HDL IS LOW AND LDL HIGH. I RECOMMEND MEDIUM DOSE TREATMENT WITH ATORVASTATIN. ( LOOK UP [employer] GUIDELINES ON LINE FOR CHOLESTEROL  TREATMENT) ITS WORTH A RETRIAL AFTER YOUR DIZZINESS EXPERIENCE WITH ANOTHER STATIN USE. WILL RECHECK NEXT VISIT. IF SIDE EFFECTS CALL us FOR EVAL AND PLAN  RIGHT FOOT PAIN -DUE TO GOUT MOST LIKELY. I REVIEWED PAST LABS THAT SHOWED MILD INCREASE URIC ACID. EMPIRIC TREATMENT BEGUN, HANDOUT GIVEN TO PT, DIET ADVISE GIVEN, F/U AT NEXT VISIT TO CHECK URIC ACID.  BP at GOAL, ON NO MEDS.   WELLNESS-  PROSTATE CANCER SCREEN WITH DIGITAL EXAM TODAY, NEGATIVE. WILL DO PSA NEXT VISIT  COLON0SOCOPY SCREEN CONSULT PLACED  DIET AND EXERCISE DISCUSSED. SKIN CARE DISCUSSED. FLUVAX RECOMMENDED YEARLY. [place]

## 2015-05-03 ENCOUNTER — Encounter (INDEPENDENT_AMBULATORY_CARE_PROVIDER_SITE_OTHER): Payer: Self-pay | Admitting: Internal Medicine

## 2015-05-03 NOTE — Telephone Encounter (Signed)
From: Susanne Borders  To: Governor Rooks, MD  Sent: 05/03/2015 8:40 AM PST  Subject: 20-Other    Colchicine is not on the formulary per my insurance. The pharmacist informed me of a formulary equivalent called Allopurinol. Please advise if this will work for my condition. If yes, I appreciate the authorization be communicated to my pharmacy. Thank you!

## 2015-05-03 NOTE — Progress Notes (Signed)
Dr Mardene Speak PT, AFTER REVIEW OF CHART, I SEE NO REASON FOR THE EXEDRIN MIGRAINE UNLESS HE HAS HX OF MIGRAINES   ALSO SHOULD NOT USE THE COLCHICINE AND THE MOTRIN OR EXCEDRIN MIGRAINE AT SAME TIME.     Jamie-Left detailed message on patients vm

## 2015-05-03 NOTE — Telephone Encounter (Signed)
I called and discussed with the pts wife that we have submitted for prior auth to hopefully get the medication covered.

## 2015-05-12 ENCOUNTER — Other Ambulatory Visit (INDEPENDENT_AMBULATORY_CARE_PROVIDER_SITE_OTHER): Payer: Self-pay | Admitting: Internal Medicine

## 2015-05-12 DIAGNOSIS — M10071 Idiopathic gout, right ankle and foot: Principal | ICD-10-CM

## 2015-05-12 MED ORDER — NAPROXEN 500 MG OR TABS
500.0000 mg | ORAL_TABLET | Freq: Two times a day (BID) | ORAL | 0 refills | Status: DC
Start: 2015-05-12 — End: 2015-06-13

## 2015-05-12 NOTE — Telephone Encounter (Signed)
Spouse informed.

## 2015-05-12 NOTE — Telephone Encounter (Addendum)
Call from spouse stating that steve is still having issues with his toe pain (gout) he took 2 treatments of the Colcrys and only got 60% better which lasted about a week. This week it is now 100% painful.  Questions:  1. Are you sure it's gout, since you didn't get blood work to confirm?  2.What's the next step?   3. Same treatment or different RX?    Response-    Begin naprosyn, 1000 mg now then 500 mg 12 hours later and every 12 hours, take with food. The history sounds like inflamation, and the possibilities are gout, pseudogout, acute osteoarthritis, injury. It is not consistant with infection unless fever, severe redness, loss or range of motion. The naprosyn treats all but infection. At these relatively higher doses. But some inflammation can be harder to treat and we have to use difff meds, sometimes prednisone. We should see him Wednesday. If sx worsen go to [facility] urgent care. If naprosyn causes GI upset, try it with pepcid ac or stop it. Also use ice.

## 2015-05-17 ENCOUNTER — Ambulatory Visit (INDEPENDENT_AMBULATORY_CARE_PROVIDER_SITE_OTHER): Payer: BC Managed Care – PPO | Admitting: Internal Medicine

## 2015-05-17 ENCOUNTER — Other Ambulatory Visit (INDEPENDENT_AMBULATORY_CARE_PROVIDER_SITE_OTHER): Payer: Self-pay | Admitting: Internal Medicine

## 2015-05-17 VITALS — BP 138/88 | HR 64 | Temp 98.0°F | Ht 68.25 in | Wt 207.0 lb

## 2015-05-17 DIAGNOSIS — E790 Hyperuricemia without signs of inflammatory arthritis and tophaceous disease: Secondary | ICD-10-CM

## 2015-05-17 DIAGNOSIS — R600 Localized edema: Secondary | ICD-10-CM

## 2015-05-17 DIAGNOSIS — M79671 Pain in right foot: Secondary | ICD-10-CM

## 2015-05-17 DIAGNOSIS — M109 Gout, unspecified: Secondary | ICD-10-CM

## 2015-05-17 DIAGNOSIS — K219 Gastro-esophageal reflux disease without esophagitis: Secondary | ICD-10-CM

## 2015-05-17 DIAGNOSIS — M10071 Idiopathic gout, right ankle and foot: Principal | ICD-10-CM

## 2015-05-17 MED ORDER — OMEPRAZOLE 20 MG OR CPDR
20.00 mg | DELAYED_RELEASE_CAPSULE | Freq: Every day | ORAL | 0 refills | Status: AC
Start: 2015-05-17 — End: ?

## 2015-05-17 NOTE — Progress Notes (Signed)
Derrick Werner is a 55 year old male who presents for   Chief Complaint   Patient presents with   . Foot Pain     Right foot       History of Present Illness:      HE WAS ABLE TO COLCHICINE FOR HIS RIGHT LATERAL FOOT PAIN, AFTER LAST VISIT, FOR 2 TABS FOLLOWED BY 1 HOUR LATER. HE GOT A GOOD RESPONSE, ABOUT 75-80 % BETTER. THE NEXT DAY, PAIN, SWELLING IN THE SAME AREA RETURNED. AT THAT POINT HE REPEATED THE DOSE, AND SAME RESULTS BUT SHORT LIVED. HE CALLED [place] SEVERAL DAYS LATER, AND WE CHANGED TX TO NAPROSYN, 2 X PER DAY. THIS WORKED QUICKLY, AND HE REMAINS ON IT. THERE IS 85 % IMPROVEMENT ON IT . WITH THE NAPROSYN HE HAS HAD HEARTBURN, INDEPENDENT OF EATING. MILK HELPED THIS. NO N,V,, CHANGE IN BOWELS OR BLEEDING. NO PMH ULCER. NON SMOKER. HE USES EXCEDRIN  3 X PER WEEK. , AND HE HAS OCASSIONAL BEERS ( MAYBE 3 PER WEEK)  HE HAS HX OF RIGHT FOOT TX IN PAST. HE ALWAYS HAS SENSITIVITY IN THIS AREA WITH WALKING. HE ALSO HAS PAST INJURY TO HIS LOW BACK, L5-S1 RIGHT. HE HAS CHRONIC RESIDUAL LOW BACK PAIN, CAN FLARE AND HE TX WITH IBUPROFEN. HIS GAIT FAVORS HIS LEFT AFTER SURGERY ON LEFT FOOT ( DONOR TISSUE TO LEFT THUMB REPAIR) HE SAYS HIS URIC ACID HAS BEEN ELEVATED IN THE PAST   HE ALSO COMPLAINS OF PERSISTENT SWELLING FOR LAST 3 MONTHS, HE HAS BEEN HIS DIURETIC FOR 9+ MONTHS. SWELLING IS ANKLES TO MID PRETIB. WORSE AS DAY GOES ON, BEST FIRST THING IN AM. SALTY FOODS MAKE IT WORSE. HE USES IBUPROFEN 1 X PER WEEK. THE SWELLING HAS NOT WORSENED ON THE NAPROSYN.    Immunization History   Administered Date(s) Administered   . Influenza Vaccine >=3 Years 03/17/2009   . Tdap 09/30/2006   DECLINED FLUVAX       Patient Active Problem List   Diagnosis   . Actinic keratosis   . Hyperlipidemia   . GERD (gastroesophageal reflux disease)   . Hypertension   . Acute idiopathic gout of right foot   . Lactose intolerance in adult   . Wellness examination       Past Medical History   Diagnosis Date   . Actinic keratosis 10/23/2009      . GERD (gastroesophageal reflux disease)    . Hyperlipidemia    . Hypertension 10/24/2009   . Mass      Let wrist mass   . Sciatica of left side        Office Visit on 03/01/2015   Component Date Value Ref Range Status   . WBC 03/01/2015 6.9  3.4 - 10.8 x10E3/uL Final   . RBC 03/01/2015 5.23  4.14 - 5.80 x10E6/uL Final   . Hemoglobin 03/01/2015 16.4  12.6 - 17.7 g/dL Final   . Hematocrit 03/01/2015 47.5  37.5 - 51.0 % Final   . MCV 03/01/2015 91  79 - 97 fL Final   . MCH 03/01/2015 31.4  26.6 - 33.0 pg Final   . MCHC 03/01/2015 34.5  31.5 - 35.7 g/dL Final   . RDW 03/01/2015 13.6  12.3 - 15.4 % Final   . Platelets 03/01/2015 250  150 - 379 x10E3/uL Final   . Neutrophils 03/01/2015 50  % Final   . Lymphs 03/01/2015 42  % Final   . Monocytes 03/01/2015 6  % Final   .  Eos 03/01/2015 2  % Final   . Basos 03/01/2015 0  % Final   . Neutrophils (Absolute) 03/01/2015 3.5  1.4 - 7.0 x10E3/uL Final   . Lymphs (Absolute) 03/01/2015 2.9  0.7 - 3.1 x10E3/uL Final   . Monocytes(Absolute) 03/01/2015 0.4  0.1 - 0.9 x10E3/uL Final   . Eos (Absolute) 03/01/2015 0.1  0.0 - 0.4 x10E3/uL Final   . Baso (Absolute) 03/01/2015 0.0  0.0 - 0.2 x10E3/uL Final   . Immature Granulocytes 03/01/2015 0  % Final   . Immature Grans (Abs) 03/01/2015 0.0  0.0 - 0.1 x10E3/uL Final   . Creatinine, Urine 03/01/2015 CANCELED  mg/dL Final-Edited    Comment: No urine specimen received.  03/02/2015-Yang    Result canceled by the ancillary     . Microalbumin, Urine 03/01/2015 CANCELED   Final-Edited    Comment: Test not performed    Result canceled by the ancillary     . Glucose 03/01/2015 83  65 - 99 mg/dL Final   . BUN 03/01/2015 13  6 - 24 mg/dL Final   . Creatinine 03/01/2015 0.94  0.76 - 1.27 mg/dL Final   . eGFR If NonAfricn Am 03/01/2015 90  >59 mL/min/1.73 Final   . eGFR If Africn Am 03/01/2015 104  >59 mL/min/1.73 Final   . BUN/Creatinine Ratio 03/01/2015 14  9 - 20 Final   . Sodium 03/01/2015 145* 136 - 144 mmol/L Final                  **Please  note reference interval change**   . Potassium 03/01/2015 5.5* 3.5 - 5.2 mmol/L Final                  **Please note reference interval change**   . Chloride 03/01/2015 104  97 - 106 mmol/L Final                  **Please note reference interval change**   . Carbon Dioxide 03/01/2015 24  18 - 29 mmol/L Final   . Calcium 03/01/2015 10.0  8.7 - 10.2 mg/dL Final   . Protein, Total, Serum 03/01/2015 6.9  6.0 - 8.5 g/dL Final   . Albumin 03/01/2015 4.7  3.5 - 5.5 g/dL Final   . Globulin, Total 03/01/2015 2.2  1.5 - 4.5 g/dL Final   . A/G Ratio 03/01/2015 2.1  1.1 - 2.5 Final   . Bilirubin, Total 03/01/2015 0.6  0.0 - 1.2 mg/dL Final   . Alkaline Phos 03/01/2015 63  39 - 117 IU/L Final   . AST 03/01/2015 23  0 - 40 IU/L Final   . ALT (SGPT) 03/01/2015 23  0 - 44 IU/L Final   . Cholesterol 03/01/2015 230* 100 - 199 mg/dL Final   . Triglycerides 03/01/2015 230* 0 - 149 mg/dL Final   . HDL Cholesterol 03/01/2015 38* >39 mg/dL Final    Comment: According to ATP-III Guidelines, HDL-C >59 mg/dL is considered a  negative risk factor for CHD.     Marland Kitchen VLDL Cholesterol Cal 03/01/2015 46* 5 - 40 mg/dL Final   . LDL Cholesterol Calc 03/01/2015 146* 0 - 99 mg/dL Final   . LDL Chol. (Direct) 03/01/2015 170* 0 - 99 mg/dL Final   . White Blood Cells (WBC), Stool 03/01/2015 CANCELED   Final-Edited    Comment: No Ova / Parasite transport container received.  No frozen specimen received.  03/02/2015-Merrill  Performed At: [address] [place]  [address] [address]  [address]  [place], [place]  [id number]  Orene Desanctis R MD  [phone]    Result canceled by the ancillary     . C difficile Toxins A+B, EIA 03/01/2015 CANCELED   Final-Edited    Comment: No Ova / Parasite transport container received.  No frozen specimen received.  03/02/2015-Merrill    Result canceled by the ancillary     . Ova + Parasite Exam 03/01/2015 CANCELED   Final-Edited    Comment: No Ova / Parasite transport container received.  No frozen specimen  received.  03/02/2015-Merrill  These results were obtained using wet preparation(s) and trichrome  stained smear. This test does not include testing for Cryptosporidium  parvum, Cyclospora, or Microsporidia.    Result canceled by the ancillary     . Creatinine, Urine 03/06/2015 89.7  Not Estab. mg/dL Final   . Microalbumin, Urine 03/06/2015 6.0  Not Estab. ug/mL Final   . Microalb/Creat Ratio 03/06/2015 6.7  0.0 - 30.0 mg/g creat Final    Comment: Performed At: [facility]  [address]  [place], [place]  [id number]  Shawn Stall MD  [phone]     . C difficile Toxins A+B, EIA 03/06/2015 Negative  Negative Final    Comment: Performed At: [address]  [facility]  [address]  [place], [place]  [id number]  Shawn Stall MD  [phone]     . Salmonella/Shigella Screen 03/06/2015 Final report   Final   . Result 1 03/06/2015 Comment   Final    No Salmonella or Shigella recovered.   . Campylobacter Culture 03/06/2015 Final report   Final   . Result 1 03/06/2015 Comment   Final    No Campylobacter species isolated.   . E coli Shiga Toxin EIA 03/06/2015 Negative  Negative Final   . Status Message 03/06/2015 Comment   Final    Comment: Written Authorization  Written Authorization  Written Authorization Received.  Authorization received from [facility] 03-09-2015  Logged by Sallee Lange  Performed At: [address]  [address]  [place], [place]  [id number]  Shawn Stall MD  [phone]     . Ova + Parasite Exam 03/06/2015 Final report   Final    Comment: These results were obtained using wet preparation(s) and trichrome  stained smear. This test does not include testing for Cryptosporidium  parvum, Cyclospora, or Microsporidia.     . Ova & Parasites Result 03/06/2015 Comment   Final    No ova, cysts, or parasites seen.   Dema Severin Blood Cells (WBC), Stool 03/06/2015 Final report  None Seen Final   . WBC Result 03/06/2015 Comment   Final     Comment: No white blood cells seen.  Performed At: [address]  [facility]  [address]  [place], [place]  [id number]  Shawn Stall MD  [phone]         Past Surgical History   Procedure Laterality Date   . S/p left thumb reconstruction  ~1992 x 6 surgeries     with a great toe wraparound flap approximately 20 years ago    . Wisdom teeth extractions         Current Outpatient Prescriptions   Medication Sig Dispense Refill   . aspirin-acetaminophen-caffeine (EXCEDRIN) 250-250-65 MG tablet Take 1 tablet by mouth  every 6 hours as needed for Mild Pain (Pain Score 1-3).     Marland Kitchen atorvastatin (LIPITOR) 40 MG tablet Take 1 tablet (40 mg) by mouth daily. 30 tablet 6   . ibuprofen (MOTRIN) 800 MG tablet Take 800 mg by mouth every 6 hours as needed for Mild Pain (Pain Score 1-3).     . naproxen (NAPROSYN) 500 MG tablet Take 1 tablet (500 mg) by mouth 2 times daily (with meals). 30 tablet 0   . omeprazole (PRILOSEC) 20 MG capsule Take 1 capsule (20 mg) by mouth daily. 30 capsule 0     No current facility-administered medications for this visit.        No Known Allergies    Social History     Social History   . Marital status: Married     Spouse name: ALARIE   . Number of children: 2   . Years of education: N/A     Occupational History   . retired; Museum/gallery exhibitions officer      Social History Main Topics   . Smoking status: Former Smoker     Packs/day: 0.50     Years: 10.00     Types: Cigarettes   . Smokeless tobacco: Former Systems developer     Quit date: 2010      Comment: Currently smokes electronic cigarettes: Vapor: Fills it twice weekly. Using this since ~04/2012   . Alcohol use 1.8 - 16.8 oz/week     0 - 3 Glasses of wine, 3 Standard drinks or equivalent per week   . Drug use: 1.00 per week     Special: Marijuana   . Sexual activity: Yes     Other Topics Concern   . Special Diet No   . Exercise Yes     Social History Narrative    MARRIED Dierdre Harness)       Family History   Problem Relation Age of Onset   . Cancer  Mother    . Heart Disease Father                            Review Of Systems  Review of Systems   Constitutional: Positive for activity change (SOME DECREASE SINCE RIGHT FOOT PAIN). Negative for appetite change, diaphoresis, fatigue and fever.   Gastrointestinal: Negative for abdominal pain.        SOMETIMES FEELS BLOATED, WONDERS IF HE IS RETAINING FLUID   Musculoskeletal: Negative for arthralgias, back pain (NO CURRENT BACK PAIN) and myalgias.   Skin: Negative for rash.       PHYSICAL EXAMINATION:  BP 138/88  Pulse 64  Temp 98 F (36.7 C) (Oral)  Ht 5' 8.25" (1.734 m)  Wt 93.9 kg (207 lb)  BMI 31.24 kg/m2    Physical Exam   Constitutional: He is oriented to person, place, and time. He appears well-developed and well-nourished.   Abdominal: Soft.   Musculoskeletal: He exhibits no edema or tenderness.        Right ankle: He exhibits normal range of motion, no swelling, no ecchymosis, no deformity and normal pulse. No tenderness. Achilles tendon exhibits no pain.        Feet:    C= no: tenderness, red, warmth or swelling    Gait slighty favors the right leg     Neurological: He is alert and oriented to person, place, and time.   Skin: No rash noted. No pallor.   Psychiatric: He has  a normal mood and affect.       ASSESSMENT/PLAN:    ICD-10-CM ICD-9-CM    1. Acute idiopathic gout of right foot M10.071 274.01    2. Gout, unspecified cause, unspecified chronicity, unspecified site M10.9 274.9 URIC ACID, BLOOD   3. Gastroesophageal reflux disease, esophagitis presence not specified K21.9 530.81 omeprazole (PRILOSEC) 20 MG capsule       X RAY  BOTH FEET, LOOKING FOR ANATOMIC ISSUES AND EVIDENCE OF CRYSTALLINE DISEASE IF PRESENT  CONTINUE NAPROSYN, CHANGE TO ONCE PER DAY AND ADD PPI FOR STOMACH PROTECTION/GERD. IF FLARES, GO BACK TO 2 X PER DAY.  MAY ADD MEDICATION TO LOWER URIC ACID LESS THAN 6. MAY NEED RHEUM CONSULT  DISCUSSED THE DIFFERENTIAL DIAGNOSIS- GOUT, PSEUDOGOUT, INJURY, ARTHRITIS, OVERUSE WEAR AND TEAR.    YOU NEED VERY COMFORTABLE PADDED SHOES, MAY NEED GEL INSERT.  EDEMA MAY BE DUE TO SODIUM RETENTION, ON EXAM TODAY THERE I  S NO VISIBLE EDEMA. NO VENOUS FINDINGS, NO CARDIAC, RENAL OR LIVER  DYSFXN. THE NSAIDS CAN CAUSE EDEMA. (DISCUSSED WITH HIM)

## 2015-05-17 NOTE — Patient Instructions (Signed)
X RAY  BOTH FEET, LOOKING FOR ANATOMIC ISSUES AND EVIDENCE OF CRYSTALLINE DISEASE IF PRESENT  CONTINUE NAPROSYN, CHANGE TO ONCE PER DAY AND ADD PPI FOR STOMACH PROTECTION. IF FLARES, GO BACK TO 2 X PER DAY.  MAY ADD MEDICATION TO LOWER URIC ACID LESS THAN 6  DISCUSSED THE DIFFERENTIAL DIAGNOSIS- GOUT, PSEUDOGOUT, INJURY, ARTHRITIS, OVERUSE WEAR AND TEAR.   YOU NEED VERY COMFORTABLE PADDED SHOES, MAY NEED GEL INSERT.

## 2015-05-19 LAB — URIC ACID, BLOOD: Uric Acid: 5.9 mg/dL (ref 3.7–8.6)

## 2015-05-19 LAB — SPECIMEN STATUS REPORT-LABCORP

## 2015-05-23 ENCOUNTER — Telehealth (INDEPENDENT_AMBULATORY_CARE_PROVIDER_SITE_OTHER): Payer: Self-pay | Admitting: Internal Medicine

## 2015-05-23 DIAGNOSIS — M10071 Idiopathic gout, right ankle and foot: Principal | ICD-10-CM

## 2015-05-23 NOTE — Telephone Encounter (Signed)
Normal xray per dr Clovis Riley ok to see rheumatology Dr. Maxie Barb patient with info on how to schedule appt.

## 2015-06-01 ENCOUNTER — Telehealth (INDEPENDENT_AMBULATORY_CARE_PROVIDER_SITE_OTHER): Payer: Self-pay | Admitting: Internal Medicine

## 2015-06-01 DIAGNOSIS — Z1211 Encounter for screening for malignant neoplasm of colon: Principal | ICD-10-CM

## 2015-06-01 NOTE — Telephone Encounter (Signed)
Spouse called stating they prefer to go to our GI dept for the screening colonoscopy instead of dr Quintella Baton. Referral put in.

## 2015-06-05 ENCOUNTER — Other Ambulatory Visit (HOSPITAL_BASED_OUTPATIENT_CLINIC_OR_DEPARTMENT_OTHER): Payer: Self-pay | Admitting: Gastroenterology

## 2015-06-05 ENCOUNTER — Encounter (HOSPITAL_BASED_OUTPATIENT_CLINIC_OR_DEPARTMENT_OTHER): Payer: Self-pay | Admitting: Gastroenterology

## 2015-06-05 DIAGNOSIS — Z1211 Encounter for screening for malignant neoplasm of colon: Principal | ICD-10-CM

## 2015-06-05 NOTE — Telephone Encounter (Signed)
Please send patients bowel prep to [place], [place] - [phone]. Procedure is scheduled for 06/19/2015 with Dr. Oneita Jolly. Thanks

## 2015-06-06 MED ORDER — PEG 3350-KCL-NABCB-NACL-NASULF 236 GM OR SOLR
4.00 L | Freq: Once | ORAL | 0 refills | Status: AC
Start: 2015-06-06 — End: 2015-06-06

## 2015-06-06 NOTE — Telephone Encounter (Signed)
Bowel prep request received. Pended and routed to provider for review.

## 2015-06-12 ENCOUNTER — Ambulatory Visit (INDEPENDENT_AMBULATORY_CARE_PROVIDER_SITE_OTHER): Payer: BC Managed Care – PPO | Admitting: Rheumatology

## 2015-06-12 VITALS — BP 189/104 | HR 80 | Resp 17 | Ht 68.27 in | Wt 207.0 lb

## 2015-06-12 DIAGNOSIS — M79671 Pain in right foot: Principal | ICD-10-CM

## 2015-06-12 MED ORDER — COLCHICINE 0.6 MG OR TABS
0.6000 mg | ORAL_TABLET | Freq: Every day | ORAL | 2 refills | Status: AC
Start: 2015-06-12 — End: 2015-06-22

## 2015-06-12 NOTE — Progress Notes (Signed)
Reason for Visit: Presenting in referral from Dr. Clovis Riley (PCP) given suspected gout    History of the Presenting Illness:  States that over the last ~3 years he has experienced a unique pain at his right foot.  Localizes the sharp pain to the mid to lateral aspect of the right foot.  Will have no pain at this site and then the next day can have quite sharp pain in this specific location.  Foot is painful to touch at the affected site and more painful with walking. Does not correlate the episodes of pain with any new or unusual activities.  Can experience a popping sensation at the site of focal pain (even when not undergoing an episode of pain)Does not recall what he was eating on the day prior to attacks.  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  Thinks that activity may trigger the episodes.  Has a history of a fracture near this site at the age of [age] years of age. No other sites of acute onset joint pains.  Denies any joint swellings.  No skin rashes or changes.  Denies any history consistent with inflammatory eye or bowel disease.  Does not eat organ meats.  Eats red meat twice a week.  Also eats shrimp twice a week. Drinks a beer a couple times a week.  Does not like to drink water.  Will drink full sugar soda and juice during the day.    At time of most recent attack, sought medical care about a week after the onset.  Took colchicine 1.52m and then 0.676mabout an hour later.  Experienced relief within that day. Symptoms recurred the following day and took the colchicine as before to effect again.  Subsequently stopped taking the colchicine.  No subsequent attacks. Was then taking naprosyn 50058mID with benefit.  Now off as he does not have the pain that he previously experienced.      BP is elevated in the office today.  Experienced frequent headaches and often takes Excedrin for this.  No changes in the quality or pattern of his headaches.   No vision changes.  No SOB or chest pain.  Does not know if urine output is diminished.  Has previously been on anti-hypertensive medication but recently off.  States that with ambulatory monitoring his BP is normal.          Review of Systems: All others negative except as mentioned in the HPI.    Past Medical History   Diagnosis Date   . Actinic keratosis 10/23/2009   . GERD (gastroesophageal reflux disease)    . Hyperlipidemia    . Hypertension 10/24/2009   . Mass      Let wrist mass   . Sciatica of left side      Past Surgical History   Procedure Laterality Date   . S/p left thumb reconstruction  ~1992 x 6 surgeries     with a great toe wraparound flap approximately 20 years ago    . Wisdom teeth extractions       Family History   Problem Relation Age of Onset   . Cancer Mother    . Heart Disease Father      Social History     Social History   . Marital status: Married     Spouse name: ALARIE   . Number of children: 2   . Years of education: N/A  Occupational History   . retired; Museum/gallery exhibitions officer      Social History Main Topics   . Smoking status: Former Smoker     Packs/day: 0.50     Years: 10.00     Types: Cigarettes   . Smokeless tobacco: Former Systems developer     Quit date: 2010      Comment: Currently smokes electronic cigarettes: Vapor: Fills it twice weekly. Using this since ~04/2012   . Alcohol use 1.8 - 16.8 oz/week     0 - 3 Glasses of wine, 3 Standard drinks or equivalent per week   . Drug use: 1.00 per week     Special: Marijuana   . Sexual activity: Yes     Other Topics Concern   . Special Diet No   . Exercise Yes     Social History Narrative    MARRIED Dierdre Harness)          Current Outpatient Prescriptions:   .  aspirin-acetaminophen-caffeine (EXCEDRIN) 250-250-65 MG tablet, Take 1 tablet by mouth every 6 hours as needed for Mild Pain (Pain Score 1-3)., Disp: , Rfl:   .  atorvastatin (LIPITOR) 40 MG tablet, Take 1 tablet (40 mg) by mouth daily., Disp: 30 tablet, Rfl: 6  .  ibuprofen (MOTRIN) 800  MG tablet, Take 800 mg by mouth every 6 hours as needed for Mild Pain (Pain Score 1-3)., Disp: , Rfl:   .  naproxen (NAPROSYN) 500 MG tablet, Take 1 tablet (500 mg) by mouth 2 times daily (with meals)., Disp: 30 tablet, Rfl: 0  .  omeprazole (PRILOSEC) 20 MG capsule, Take 1 capsule (20 mg) by mouth daily., Disp: 30 capsule, Rfl: 0    Physical Examination:   06/12/15  1444   BP: (!) 189/104   Pulse: 80   Resp: 17   SpO2: 95%   Gen: A&O X4, NAD, pleasant  HEENT: no focal alopecia, no rash, no oral ulcerations, normal conjunctiva  Lungs: Clear to auscultation with good effort  Heart: RRR, S1 and S2, no murmur  Abdomen:BS+, soft, no rebound tenderness;  MSK: No evidence of synovitis.  Pain to palpation with flexion of the mid-foot on the right side  Skin: No rash, no tophi    Laboratory Data and Imaging:  Lab Results   Component Value Date    WBC 6.9 03/01/2015    WBC 7.4 05/28/2013    RBC 5.23 03/01/2015    RBC 4.86 05/28/2013    HGB 16.4 03/01/2015    HGB 15.6 05/28/2013    HCT 47.5 03/01/2015    HCT 45.1 05/28/2013    MCV 91 03/01/2015    MCV 92.8 05/28/2013    MCHC 34.5 03/01/2015    MCHC 34.6 05/28/2013    RDW 13.6 03/01/2015    RDW 12.9 05/28/2013    PLT 250 03/01/2015    PLT 255 05/28/2013    MPV 9.7 05/28/2013     Lab Results   Component Value Date    BUN 13 03/01/2015    BUN 19 05/05/2013    CREAT 0.94 03/01/2015    CREAT 0.93 05/05/2013    CL 104 03/01/2015    CL 99 05/05/2013    NA 145 03/01/2015    NA 139 05/05/2013    K 5.5 03/01/2015    K 4.3 05/05/2013    [place] 10.0 03/01/2015    [place] 10.5 05/05/2013    TBILI 0.6 03/01/2015    TBILI 0.40 05/05/2013    ALB  4.7 03/01/2015    ALB 4.8 05/05/2013    TP 6.9 03/01/2015    TP 7.6 05/05/2013    AST 23 03/01/2015    AST 26 05/05/2013    ALK 63 03/01/2015    ALK 66 05/05/2013    BICARB 24 03/01/2015    BICARB 26 05/05/2013    ALT 23 03/01/2015    ALT 26 05/05/2013    GLU 83 03/01/2015    GLU 84 05/05/2013     05/17/2015  Uric acid 5.9 (at time of ongoing  attack)    05/22/2015 x-ray of the right foot  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 No acute fracture or dislocation.  Soft tissues within normal limits.  05/22/2015 x-ray of the left foot  Surgical clips present. No acute fracture or dislocation. No definite bony erosions.    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 No history of an arthrocentesis/crystal analysis. Mr. Mondry has a history of recurrent, intense pain affecting the lateral aspect of the right mid-foot.  Responsive to colchicine and NSAIDs. Of note, has a history of a fracture at this site during his teens. No obvious inciting events.  Unable to make correlations between ingestions of foodstuffs known to cause gout and onset of foot pain at this time.  However, does eat shrimp and red meat; drinks a few beers/week; drinks fruit juice during the day.  Recent attack likely prolonged given that he had symptoms for about a week prior to seeking medical attention. Uric acid level was 5.9 at time of attack and can be lowered at time of an acute attack.    -Advised Mr. Atlas of likely diagnosis of gout.  -Sent to lab for a repeat CBC, CMP, ESR, CRP, uric acid level as this time as not in the midst of an attack at this time.     -Provided with an Rx for colchicine.  To take 1.20m at first signs of attack and then 0.6587mone hour later and then 0.87m57maily until attack resolves.  Avoiding NSAIDs given hypertension. To call the clinic at time of an attack.    -Educated on foods that can trigger gout.   -Not for urate lowering therapy at this time.  May be indicated in the future. Prefers to monitor his diet and see what effect this has at this point in time.  Recent uric acid level was 6, but this was at the time of flare.     -Advised to monitor his BP as before to ensure that he is not experiencing frequent spikes. Has been normotensive at times of recent clinic visits as per review of records.

## 2015-06-12 NOTE — Patient Instructions (Signed)
Get labs done within the week  Should your foot pain recur take colchicine 1.2mg  followed by 0.6mg  one hour later and then 0.6mg  daily until symptoms resolve.  Call the clinic at the time of a flare.

## 2015-06-18 LAB — EMMI , COLONOSCOPY: EMMI Video Order Number: 17577201050

## 2015-06-19 ENCOUNTER — Ambulatory Visit
Admission: RE | Admit: 2015-06-19 | Discharge: 2015-06-19 | Disposition: A | Discharge status: Home / Self Care | Payer: BC Managed Care – PPO | Attending: Gastroenterology | Admitting: Gastroenterology

## 2015-06-19 ENCOUNTER — Encounter (HOSPITAL_BASED_OUTPATIENT_CLINIC_OR_DEPARTMENT_OTHER): Admission: RE | Disposition: A | Discharge status: Home Health | Payer: Self-pay | Attending: Gastroenterology

## 2015-06-19 ENCOUNTER — Encounter (HOSPITAL_BASED_OUTPATIENT_CLINIC_OR_DEPARTMENT_OTHER): Payer: Self-pay

## 2015-06-19 DIAGNOSIS — D125 Benign neoplasm of sigmoid colon: Secondary | ICD-10-CM | POA: Insufficient documentation

## 2015-06-19 DIAGNOSIS — E785 Hyperlipidemia, unspecified: Secondary | ICD-10-CM | POA: Insufficient documentation

## 2015-06-19 DIAGNOSIS — D123 Benign neoplasm of transverse colon: Secondary | ICD-10-CM | POA: Insufficient documentation

## 2015-06-19 DIAGNOSIS — I10 Essential (primary) hypertension: Secondary | ICD-10-CM | POA: Insufficient documentation

## 2015-06-19 DIAGNOSIS — K648 Other hemorrhoids: Secondary | ICD-10-CM | POA: Insufficient documentation

## 2015-06-19 DIAGNOSIS — K219 Gastro-esophageal reflux disease without esophagitis: Secondary | ICD-10-CM | POA: Insufficient documentation

## 2015-06-19 DIAGNOSIS — D124 Benign neoplasm of descending colon: Secondary | ICD-10-CM | POA: Insufficient documentation

## 2015-06-19 DIAGNOSIS — D122 Benign neoplasm of ascending colon: Secondary | ICD-10-CM | POA: Insufficient documentation

## 2015-06-19 DIAGNOSIS — M5432 Sciatica, left side: Secondary | ICD-10-CM | POA: Insufficient documentation

## 2015-06-19 DIAGNOSIS — L570 Actinic keratosis: Secondary | ICD-10-CM | POA: Insufficient documentation

## 2015-06-19 DIAGNOSIS — Z0000 Encounter for general adult medical examination without abnormal findings: Secondary | ICD-10-CM

## 2015-06-19 DIAGNOSIS — Z1211 Encounter for screening for malignant neoplasm of colon: Principal | ICD-10-CM | POA: Insufficient documentation

## 2015-06-19 DIAGNOSIS — K635 Polyp of colon: Secondary | ICD-10-CM

## 2015-06-19 DIAGNOSIS — K5730 Diverticulosis of large intestine without perforation or abscess without bleeding: Secondary | ICD-10-CM | POA: Insufficient documentation

## 2015-06-19 SURGERY — COLONOSCOPY
Anesthesia: Moderate Sedation - by non-anesthesia staff only

## 2015-06-19 MED ORDER — DIPHENHYDRAMINE HCL 50 MG/ML IJ SOLN
INTRAMUSCULAR | Status: DC | PRN
Start: 2015-06-19 — End: 2015-06-19
  Administered 2015-06-19: 25 mg via INTRAVENOUS

## 2015-06-19 MED ORDER — MIDAZOLAM HCL 5 MG/5ML IJ SOLN
INTRAMUSCULAR | Status: DC | PRN
Start: 2015-06-19 — End: 2015-06-19
  Administered 2015-06-19: 2 mg via INTRAVENOUS
  Administered 2015-06-19 (×5): 1 mg via INTRAVENOUS

## 2015-06-19 MED ORDER — FENTANYL CITRATE (PF) 100 MCG/2ML IJ SOLN
INTRAMUSCULAR | Status: DC | PRN
Start: 2015-06-19 — End: 2015-06-19
  Administered 2015-06-19: 25 ug via INTRAVENOUS
  Administered 2015-06-19: 50 ug via INTRAVENOUS
  Administered 2015-06-19 (×3): 25 ug via INTRAVENOUS

## 2015-06-19 SURGICAL SUPPLY — 5 items
BIOPSY FORCEP RADIAL JAW 4 2.8MM X 240CM, LARGE W/NEEDLE (Misc Medical Supply) ×2
NEEDLE CARR-LOCKE INJECTION 25G 230 CM (Needles/punch/cannula/biopsy) ×2
PAD GROUND VALLEYLAB REM ADULT E7507 (Misc Medical Supply) ×2
SNARE LG POLYPECTOMY 27MM/240 (Misc Medical Supply) ×2 IMPLANT
TRAP POLYP E TRAP (Misc Medical Supply) ×2

## 2015-06-19 NOTE — Discharge Instructions (Signed)
Instructions discussed.

## 2015-06-19 NOTE — Procedures (Signed)
Patient Name: Derrick Werner   MR#: [id number]   Date/Time ofProcedure: 06/19/2015 12:00:00 PM 03:30 PM   Endoscopist: Marvis Repress     GI Fellow: None     Referring Physician: Daylene Katayama     PROCEDURE PERFORMED: Colonoscopy with biopsy, saline submucosal   injection, and snare cautery polypectomy     INDICATIONS FOR EXAMINATION: Index screening for colorectal   cancer in a patient at average risk.     INSTRUMENTS: Olympus adult narrow band imaging colonoscope/732     MEDICATIONS: Versed 7 mg IVP, Fentanyl 125 mcg IVP, Benadryl 25   mg IVP   NEED FOR ANESTHESIA:Moderate sedation    The attending physician, Elly Modena, was present for the   entire examination.     PROCEDURE TECHNIQUE: A physical exam was performed. Informed   consent was obtained from the patient (or the patient's   designee) after explaining all the risks (perforation, bleeding,   infection, missed lesions, and adverse effects to the medicine),   benefits, and alternatives to the procedure which the patient or   designee appeared to understand and so stated.  The patient was   connected to the monitoring devices and placed in the left   lateral position. Continuous oxygen was provided with a nasal   cannula and IV medicine administered through an indwelling   cannula. After adequate sedation was achieved, a digital rectal   exam was performed and the colonoscope was introduced into the   rectum and advanced under direct visualization to the terminal   ileum. The scope was subsequently removed slowly while carefully   examining the color, texture, anatomy, and integrity of the   mucosa on the way out, with 360 degree circumferential views.   Carbon dioxide gas was used for insufflation. In the rectum, the   scope was retroflexed to evaluate for anorectal pathology such   as hemorrhoids. The patient was subsequently transferred to the   recovery area in satisfactory condition.      COMPLICATIONS: None   ESTIMATED BLOOD LOSS: None   BIOPSY  TAKEN: Yes   BOWEL PREP QUALITY: good   EXTENT OF EXAM: terminal ileum     Findings: Large global fluid level requiring extensive washing   and suction. The mucosa in the terminal ileum appeared normal,   without erosion or ulcer. The mucosa throughout the colon   appeared shiny, with normal vascular pattern and folds. A total   of ten polyps were encountered and removed today as follows:   three 2 mm polyps in the ascending colon removed via forceps, a   3 mm polyp in the transverse colon removed via forceps; three 2   mm polyps in the descending colon removed via forceps; two 2 mm   polyps in the sigmoid removed via forceps; and a pedunculated 13   mm polyp in the distal sigmoid removed via saline submucosal   injection of the polyp stalk followed by snare cautery   polypectomy. Mild diverticulosis of the left colon noted.   Retroflexion in the rectum revealed small internal hemorrhoids.     Endoscopic Diagnosis: 1. A total of ten polyps were removed   today as detailed above, including nine subcentimeter polyps and   one 13 mm pedunculated polyp removed from the distal sigmoid   colon.    2. Mild left-sided diverticulosis.  3. Small internal   hemorrhoids.     Recommendations: 1. We will follow-up on the pathology results  from the polyps removed today, which will help inform when your   next colonoscopy will be due. You will receive a letter with   this information.   2. Avoid non-steroidal anti-inflammatory   drugs (such as aleve, ibuprofen, motrin) for the next one week   unless critical clinical need.  3. Fiber rich diet, with goal   25-30 grams fiber intake per day.   4. Avoid prolonged sitting   or straining with bowel movements.   5. Follow-up with your   primary care provider in 2-3 months.      Electronically signed by By Marvis Repress, MD, PID: [id number]   on 06/19/2015 5:57:37 PM

## 2015-06-19 NOTE — H&P (Signed)
History and Physical    Indication for procedure:  Index screening for colorectal cancer.      Past Medical History   Diagnosis Date   . Actinic keratosis 10/23/2009   . GERD (gastroesophageal reflux disease)    . Hyperlipidemia    . Hypertension 10/24/2009   . Mass      Let wrist mass   . Sciatica of left side          Past Surgical History   Procedure Laterality Date   . S/p left thumb reconstruction  ~1992 x 6 surgeries     with a great toe wraparound flap approximately 20 years ago    . Wisdom teeth extractions           Review of patient's allergies indicates no known allergies.      No current facility-administered medications on file prior to encounter.      Current Outpatient Prescriptions on File Prior to Encounter   Medication Sig Dispense Refill   . ibuprofen (MOTRIN) 800 MG tablet Take 800 mg by mouth every 6 hours as needed for Mild Pain (Pain Score 1-3).             06/19/15  1529   BP: 137/89   Pulse: 60   Resp: 16   Temp: 97.3 F (36.3 C)   SpO2: 96%     General:   Alert, conversable, pleasant  Lungs:   No wheezing or stridor  CV:    No friction rub or mechanical click  Abdomen:   Soft, protuberant, no borborygmus    ASA Score:   2   Airway (Mallimpati) Score:  Class II - Soft palate, uvula, and fauces are visible.    Assessment and Plan:    Proceed to planned procedure.    The patient (or the patient's designee) has consented to the procedure, which will be done with sedation.  I have assessed the patient's status immediately prior to this procedure.  I have discussed pain management needs and options for the patient with the patient or the patient's designee.      The patient  (or the patient's designee) agrees that the patient will be full code for the duration of the procedure.    Sedation options, risks, and plans have been discussed with the patient or caregiver.  Questions were answered.  The patient or caregiver agrees to proceed as planned.    Marvis Repress

## 2015-06-23 NOTE — Procedures (Signed)
FINAL PATHOLOGIC DIAGNOSIS:  A: Ascending colon polyp #1, biopsy       -Fragments of tubular adenoma.  B: Ascending colon polyp #2, biopsy       -Tubular adenoma.  C: Ascending colon polyp #3, biopsy       -Tubular adenoma.  D: Transverse colon polyp, biopsy       -Tubular adenoma.  E: Descending colon polyp #1, biopsy       -Tubular adenoma.  F: Descending colon polyp #2, biopsy       -Hyperplastic polyp.  G: Descending colon polyp #3, biopsy       -Tubular adenoma.  H: Sigmoid colon polyp #1, biopsy       -Tubular adenoma.  I:  Sigmoid colon polyp #2, biopsy       -Hyperplastic polyp.  J: Sigmoid colon polyp #3, biopsy       -Tubular adenoma with prolapse changes.  COMMENT: In parts C, F, and G, deeper levels were evaluated. This case was  reviewed intradepartmentally (MV).    SPECIMEN(S) SUBMITTED:  A:   Ascending colon polyp #1  B:   Ascending colon polyp #2  C:   Ascending colon polyp #3  D:   Transverse colon polyp  E:   Descending colon polyp #1  F:   Descending colon polyp #2  G:   Descending colon polyp #3  H:   Sigmoid colon polyp #1  I:   Sigmoid colon polyp #2  J:   Sigmoid colon polyp #3    CLINICAL HISTORY:  Index screening.  Evaluate for dysplasia.    GROSS DESCRIPTION:  A: The specimen (received in formalin labeled with the patient's name,  medical record number and "ascending colon polyp #1") consists of two  pink-tan fragments of soft tissue measuring from 0.2x0.2x0.1 cm up to  0.7x0.2x0.2 cm.  The specimen is entirely submitted in cassette A1.  B: The specimen (received in formalin labeled with the patient's name,  medical record number and "ascending colon polyp #2") consists of a single  pink-tan fragment of soft tissue measuring 0.4x0.3x0.2 cm.  The specimen is  entirely submitted in cassette B1.  C: The specimen (received in formalin labeled with the patient's name,  medical record number and "ascending colon polyp #3") consists of three  pink-tan fragments of soft tissue measuring from  0.1x0.1x<0.1 cm up to  0.6x0.2x0.1 cm.  The specimen is entirely submitted in cassette C1.  D: The specimen (received in formalin labeled with the patient's name,  medical record number and "transverse colon polyp") consists of four  pink-tan fragments of soft tissue measuring from 0.1x0.1x0.1 cm up to  0.3x0.2x0.2 cm.  The specimen is entirely submitted in cassette D1.  E: The specimen (received in formalin labeled with the patient's name,  medical record number and "descending colon polyp #1") consists of two  pink-tan fragments of soft tissue measuring 0.3x0.3x0.2 cm and 0.5x0.3x0.2  cm.  The specimen is entirely submitted in cassette E1.  F: The specimen (received in formalin labeled with the patient's name,  medical record number and "descending colon polyp #2") consists of two  pink-tan fragments of soft tissue measuring 0.3x0.1x0.1 cm and 0.4x0.2x0.2  cm.  The entire specimen is submitted in cassette F1.  G: The specimen (received in formalin labeled with the patient's name,  medical record number and "descending colon polyp #3") consists of two  pink-tan fragments of soft tissue measuring 0.3x0.2x0.1 cm and 0.3x0.3x0.1  cm.  The specimen is entirely  submitted in cassette G1.  H: The specimen (received in formalin labeled with the patient's name,  medical record number and "sigmoid polyp #1") consists of a pink-tan  fragment of soft tissue measuring 0.5x0.2x0.2 cm.  The specimen is entirely  submitted in cassette H1.  I: The specimen (received in formalin labeled with the patient's name,  medical record number and "sigmoid polyp #2") consists of a pink-tan  fragment of soft tissue measuring 0.3x0.3x0.2 cm.  The specimen is entirely  submitted in cassette I1.  J: The specimen (received in formalin labeled with the patient's name,  medical record number and "sigmoid polyp #3") consists of a red-tan,  multilobulated, polypoid fragment of soft tissue measuring 1.1x0.9x0.7 cm.  The margin of resection is inked  black, the specimen is bisected, and  entirely submitted in cassette J1.  SD  CONFIDENTIAL HEALTH INFORMATION: Health Care information is personal and  sensitive information. If it is being faxed to you it is done so under  appropriate authorization from the patient or under circumstances that do  not require patient authorization. You, the recipient, are obligated to  maintain it in a safe, secure and confidential manner. Re-disclosure  without additional patient consent or as permitted by law is prohibited.  Unauthorized re-disclosure or failure to maintain confidentiality could  subject you to penalties described in federal and state law.  If you have  received this report or facsimile in error, please notify the [facility] immediately and destroy the received document(s).    Material reviewed and Interpreted and  Report Electronically Signed by:  Elnoria Howard M.D. [phone])  06/23/15 08:36  Electronic Signature derived from a single  controlled access password

## 2015-06-30 ENCOUNTER — Encounter (INDEPENDENT_AMBULATORY_CARE_PROVIDER_SITE_OTHER): Payer: Self-pay | Admitting: Gastroenterology

## 2015-07-18 ENCOUNTER — Ambulatory Visit (INDEPENDENT_AMBULATORY_CARE_PROVIDER_SITE_OTHER): Payer: BLUE CROSS/BLUE SHIELD | Admitting: Rheumatology

## 2015-07-31 ENCOUNTER — Encounter (INDEPENDENT_AMBULATORY_CARE_PROVIDER_SITE_OTHER): Payer: BC Managed Care – PPO | Admitting: Internal Medicine

## 2016-08-13 ENCOUNTER — Telehealth (INDEPENDENT_AMBULATORY_CARE_PROVIDER_SITE_OTHER): Payer: Self-pay | Admitting: Internal Medicine

## 2016-08-13 NOTE — Telephone Encounter (Signed)
Received auth/request by fax to release medical information to [facility] in [place], [place]. Patient transferring care. Records mailed.

## 2021-05-27 IMAGING — MR MRI CSPINE WO CONTRAST
4 series · 27 of 48 positions shown · non-contrast
Comparison: None

INDICATION: Cervical radiculopathy. Neck pain with right arm pain.
TECHNIQUE: Multiplanar, multiecho imaging of the cervical spine was performed, including T1-weighted and fluid sensitive sequences without Intravenous contrast.

[Series 5: t2_sag · sagittal · 3.0mm · 0.57mm/px · 9 of 15 slices shown]
[im 1/15]
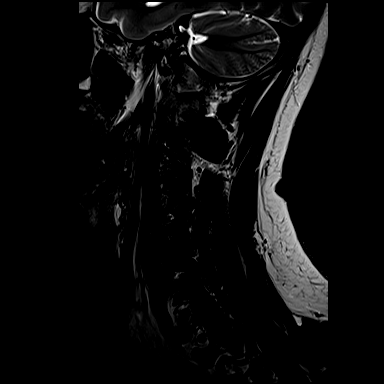
[im 2/15]
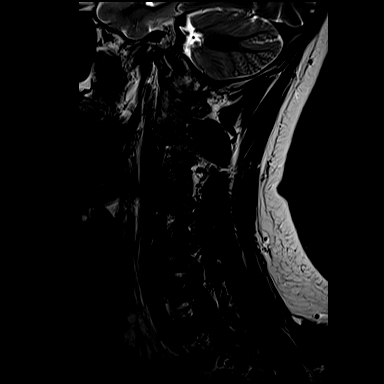
[im 4/15]
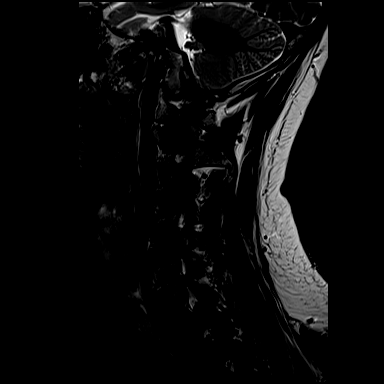
[im 6/15]
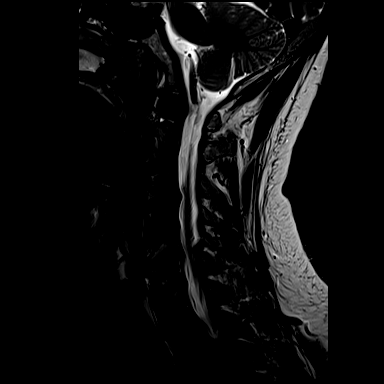
[im 8/15]
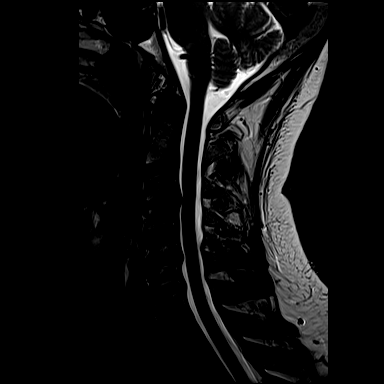
[im 9/15]
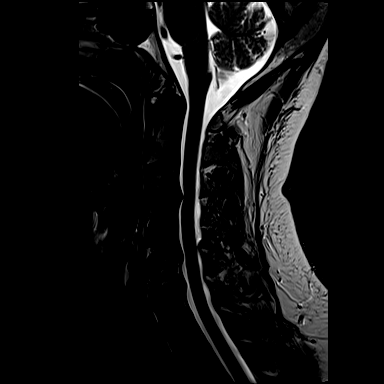
[im 11/15]
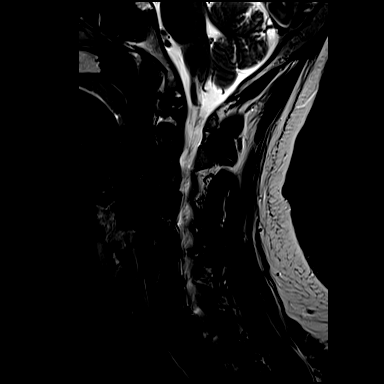
[im 13/15]
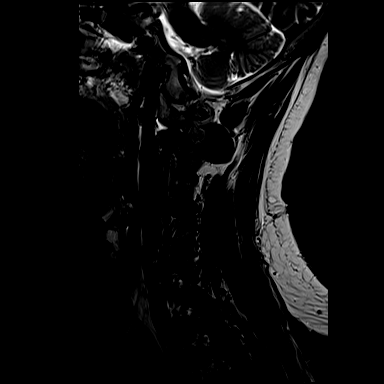
[im 15/15]
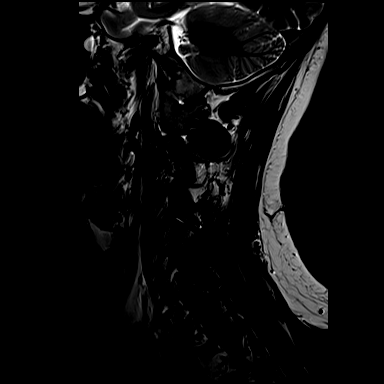

[Series 6: t1_sag · sagittal · 3.0mm · 0.69mm/px · 9 of 15 slices shown]
[im 1/15]
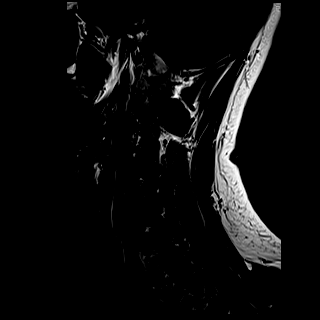
[im 2/15]
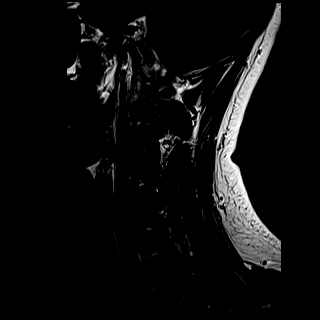
[im 4/15]
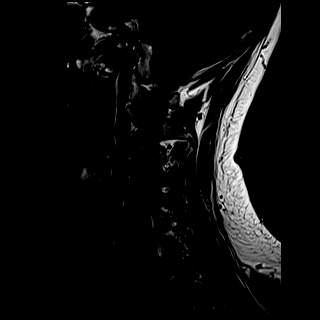
[im 6/15]
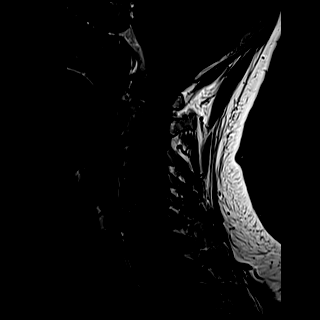
[im 8/15]
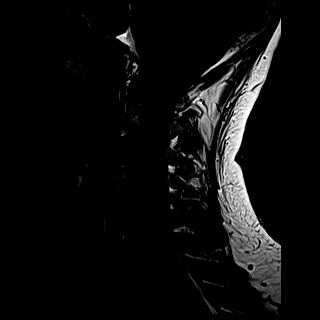
[im 9/15]
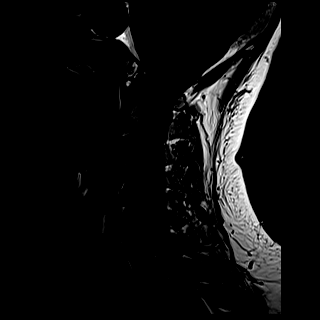
[im 11/15]
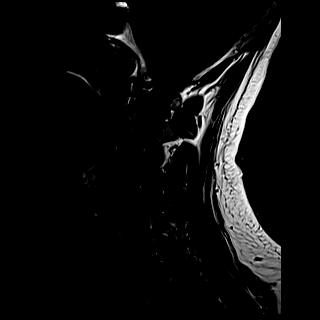
[im 13/15]
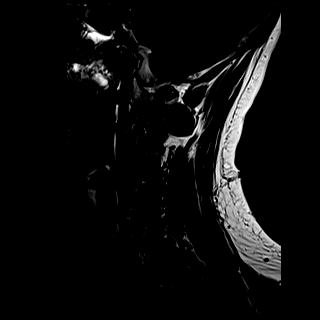
[im 15/15]
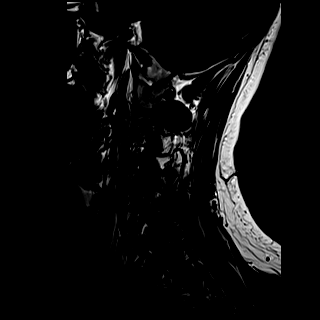

[Series 7: ir_sag · sagittal · 3.0mm · 0.34mm/px · 6 of 15 slices shown]
[im 1/15]
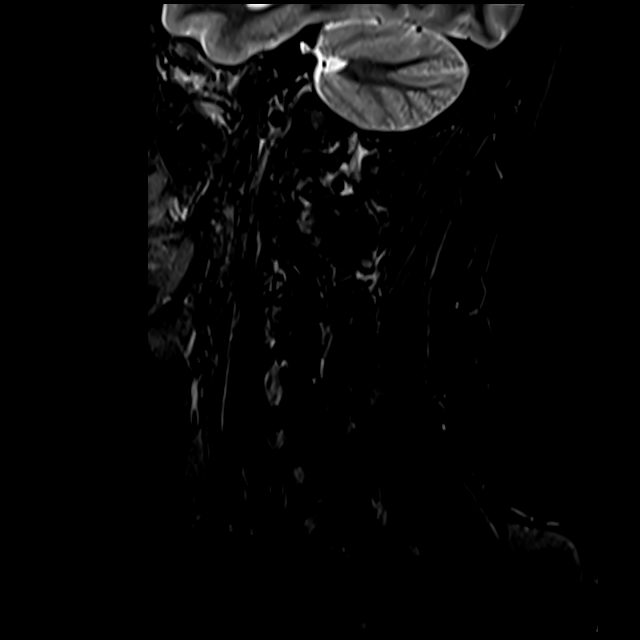
[im 2/15]
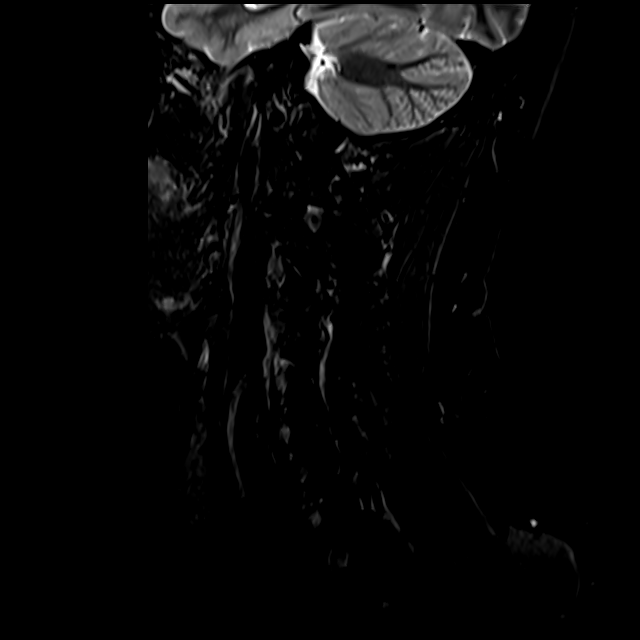
[im 4/15]
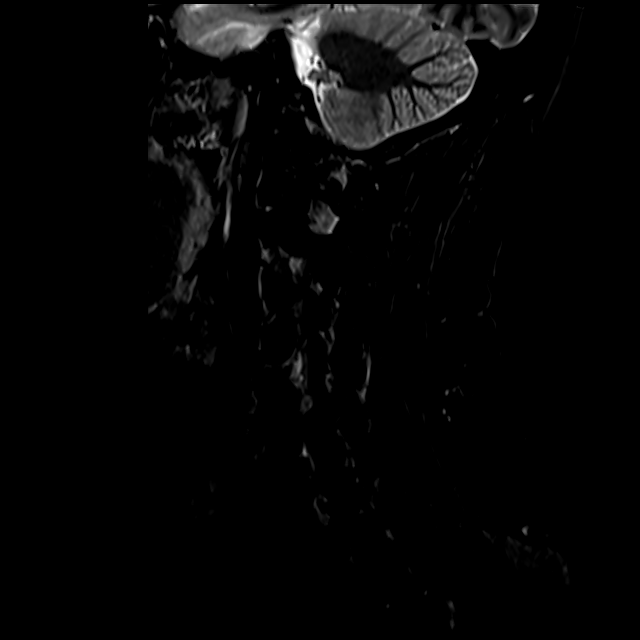
[im 6/15]
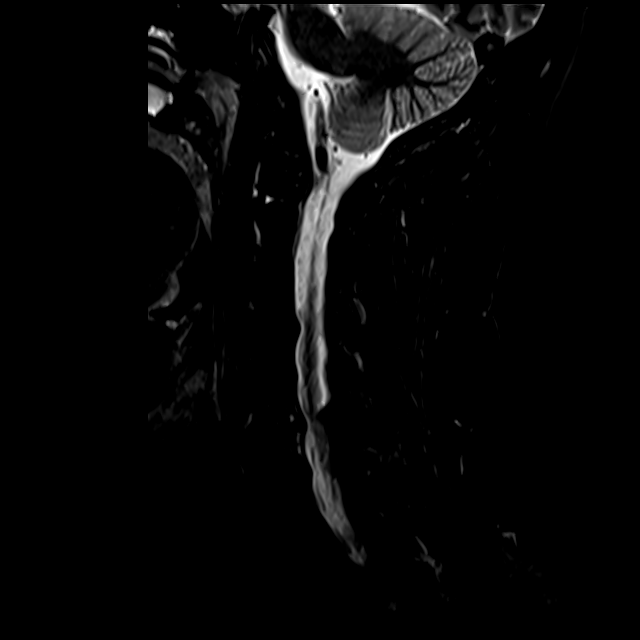
[im 8/15]
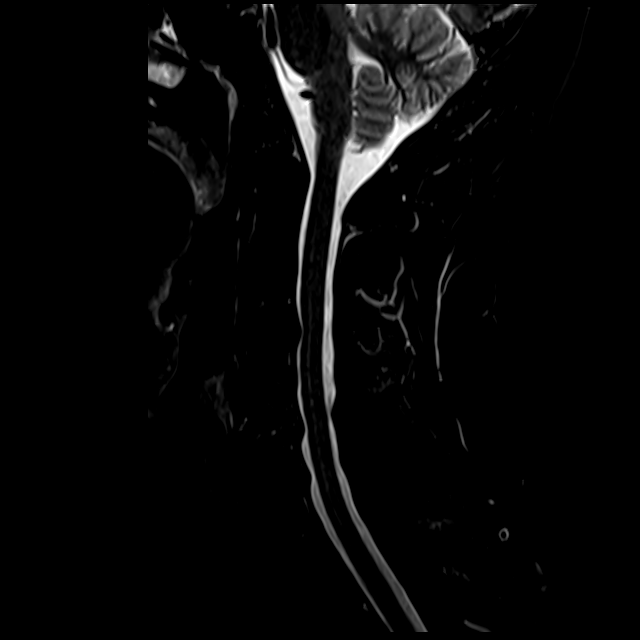
[im 13/15]
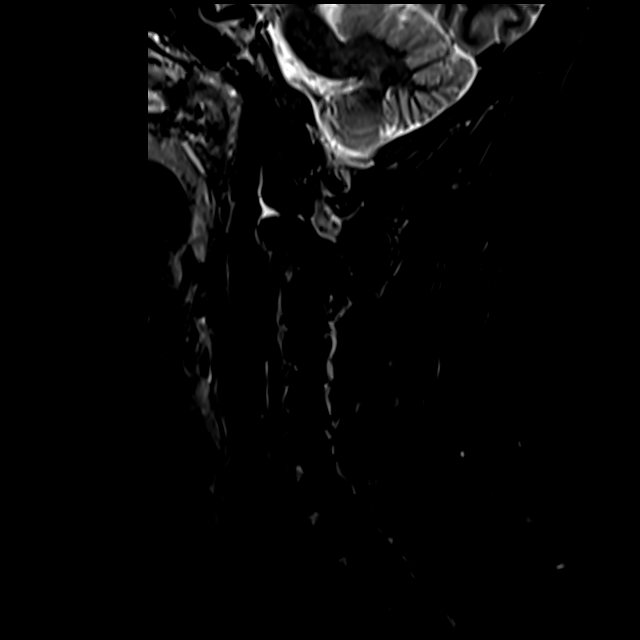

[Series 8: t2_axial · axial · 3.0mm · 0.56mm/px · z∈[-76,+3]mm · 3 of 36 slices shown]
[im 6/36]
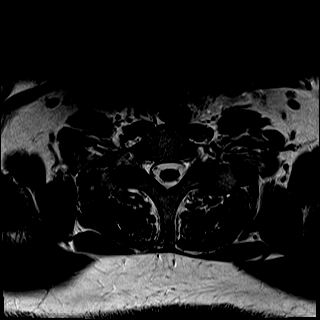
[im 18/36]
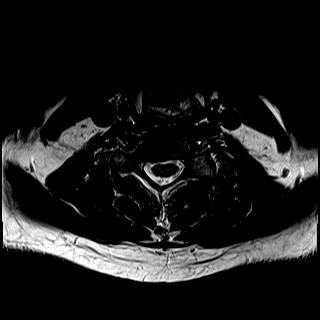
[im 30/36]
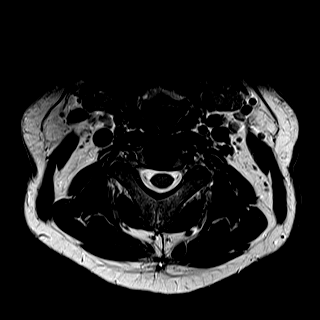

[27 of 48 positions shown; findings below may reference images not displayed]

FINDINGS: Posterior fossa:  The visualized posterior fossa is normal in appearance.  

Cervical spinal cord:  The cervical spinal cord has normal signal intensity.  

Bone marrow:  No acute fracture. Discogenic endplate reactive marrow changes at C3-C7. No suspicious lytic or blastic osseous lesion. 

Alignment: The alignment of the cervical spine is normal on these supine, neutral images.

C2-C3: No disc protrusion. No canal or foraminal stenosis. Mild bilateral facet arthrosis.

C3-C4: Moderate disc osteophyte complex. Moderate bilateral foraminal stenosis. Mild canal stenosis. Moderate bilateral facet arthrosis.

C4-C5: Minimal disc bulge. No canal or foraminal stenosis. Mild left facet arthrosis.

C5-C6: Moderate disc osteophyte complex. Moderate canal stenosis. Ligamentum flavum thickening. No facet arthrosis. Severe right and moderate left foraminal stenosis.

C6-C7: Mild disc osteophyte. Moderate left and moderate-severe right foraminal stenosis. Mild canal stenosis. No facet arthrosis.

C7-T1: No disc protrusion. No canal or foraminal stenosis. Mild left facet arthrosis.
IMPRESSION: 1.
Moderate disc osteophyte complex and facet arthrosis at C3-C4 with mild canal and moderate bilateral foraminal stenosis.

2.
Moderate disc osteophyte complex and ligamentum flavum thickening at C5-C6 with moderate canal and severe right and moderate left foraminal stenosis.

3.
Mild disc osteophyte complex at C6-C7 with mild canal and moderate-severe foraminal stenosis.

## 2021-10-02 IMAGING — CT CT ABD/PEL WO/W CONT
2 of 6 series · 12 of 46 positions shown, 14 images · non-contrast
Comparison: None.

HISTORY: 61-year-old Male with chronic pancreatitis. Patient reports epigastric tenderness, elevated enzymes, labs done [DATE].
TECHNIQUE: CT abdomen and pelvis without and with intravenous contrast. Axial images were obtained with coronal and sagittal MPR reconstructions.

[Series 6: venous · axial · portal-venous · 0.57mm/px · z∈[+1188,+1590]mm · 9 of 162 slices shown, 11 images]
[im 14/162  soft-tissue]
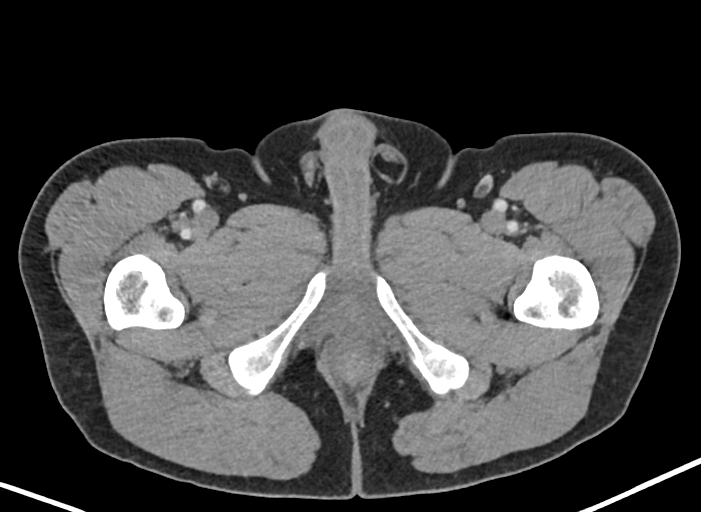
[im 14/162  bone]
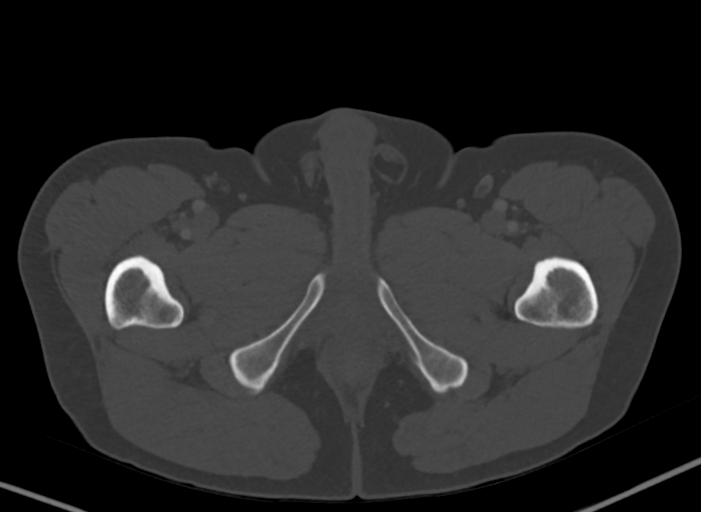
[im 27/162  soft-tissue]
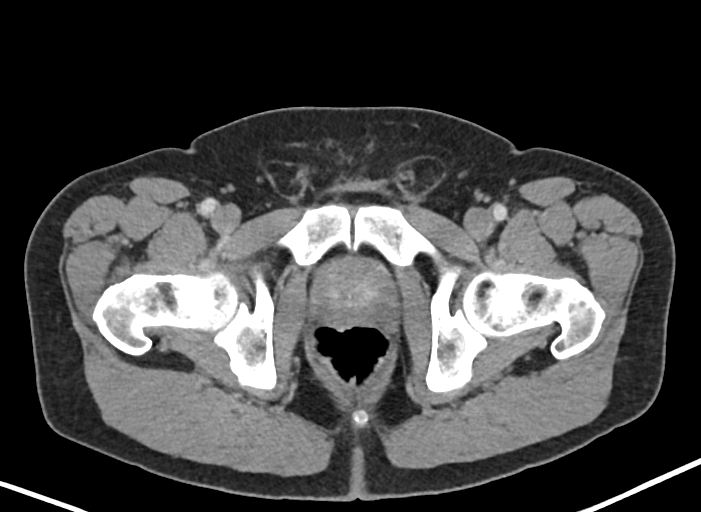
[im 54/162  soft-tissue]
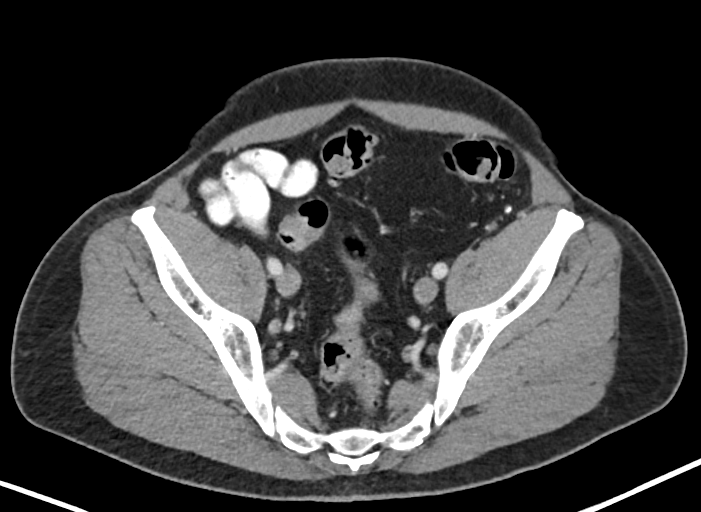
[im 68/162  soft-tissue]
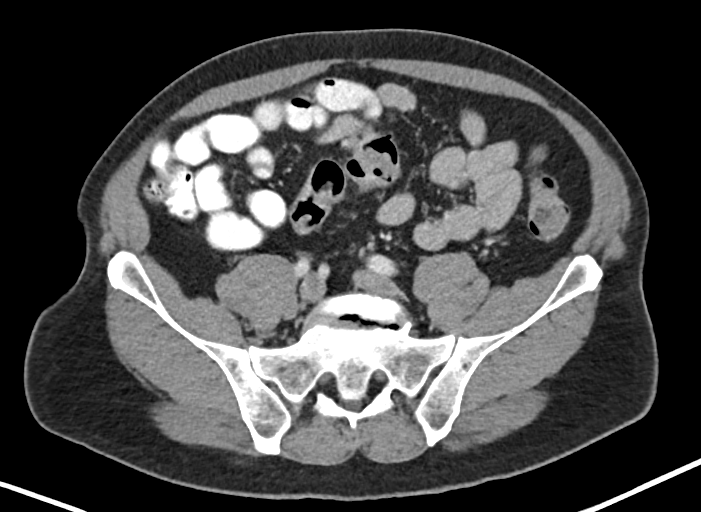
[im 81/162  soft-tissue]
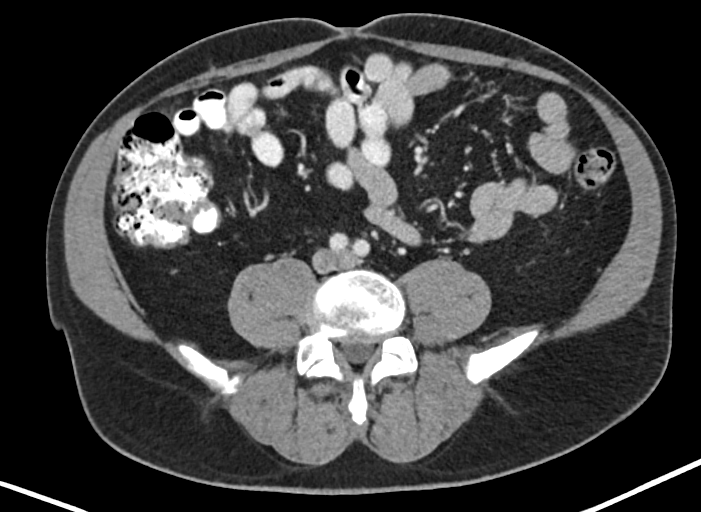
[im 94/162  soft-tissue]
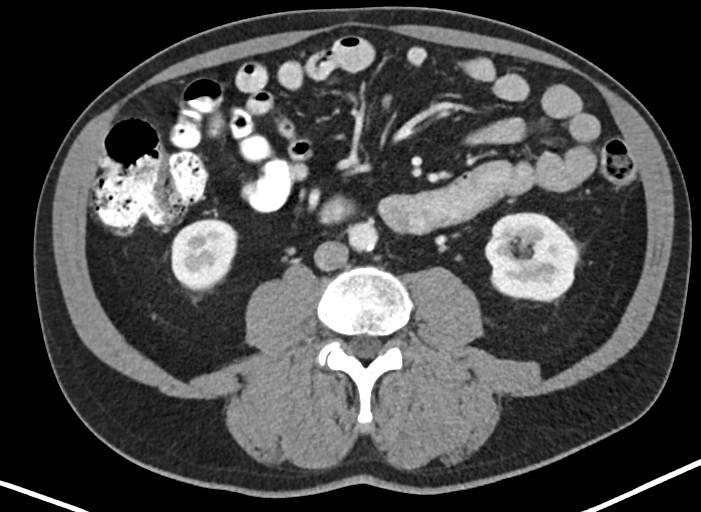
[im 108/162  soft-tissue]
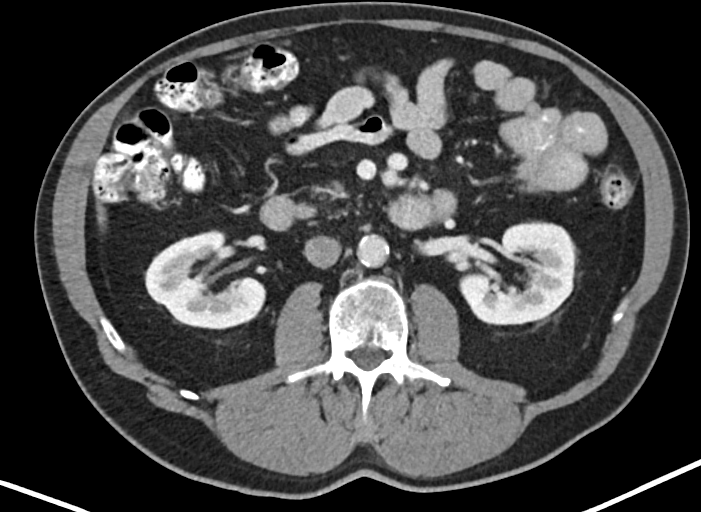
[im 135/162  soft-tissue]
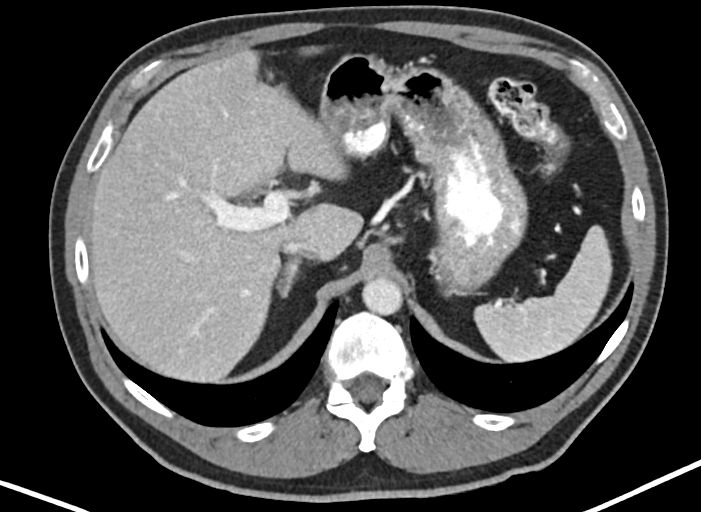
[im 148/162  soft-tissue]
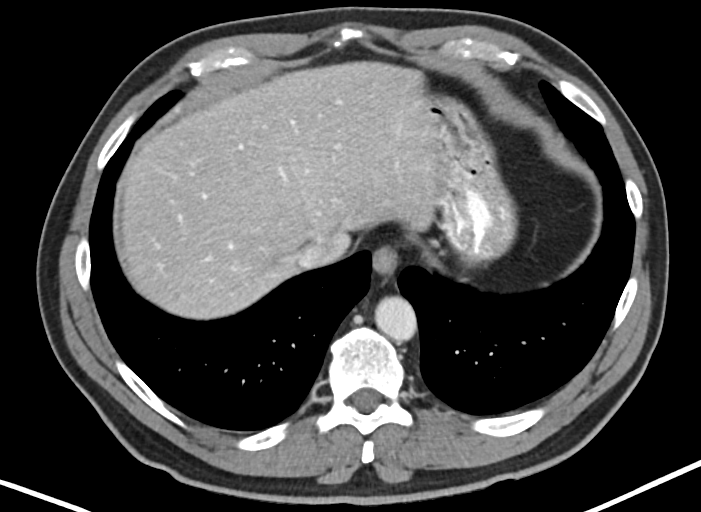
[im 148/162  bone]
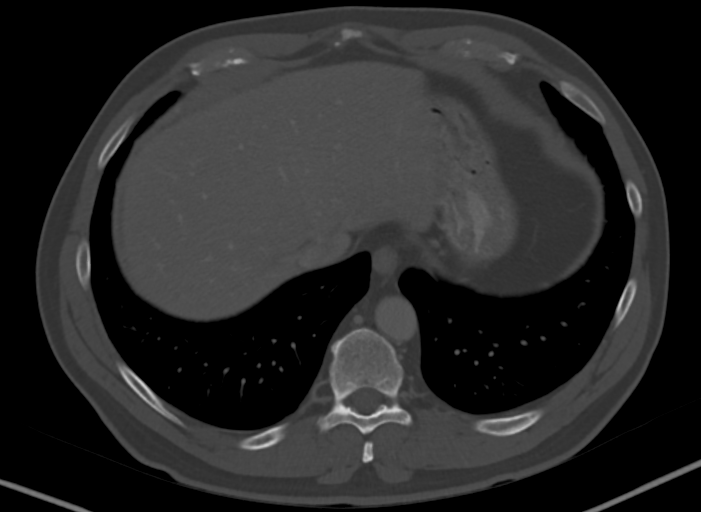

[Series 8: coronal · coronal · 0.78mm/px · 3 of 97 slices shown]
[im 33/97  soft-tissue]
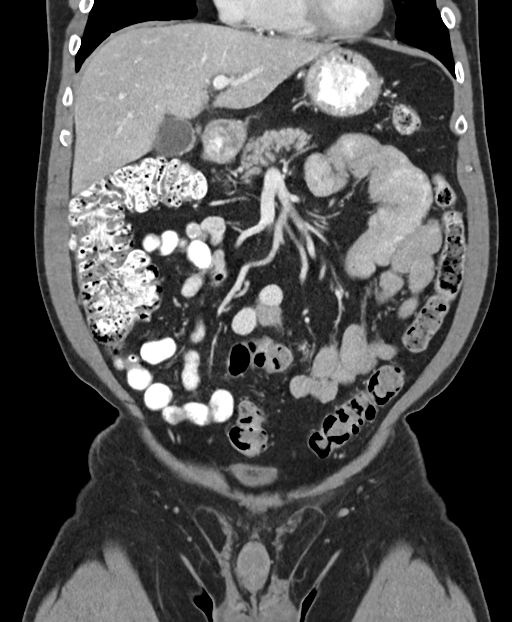
[im 43/97  soft-tissue]
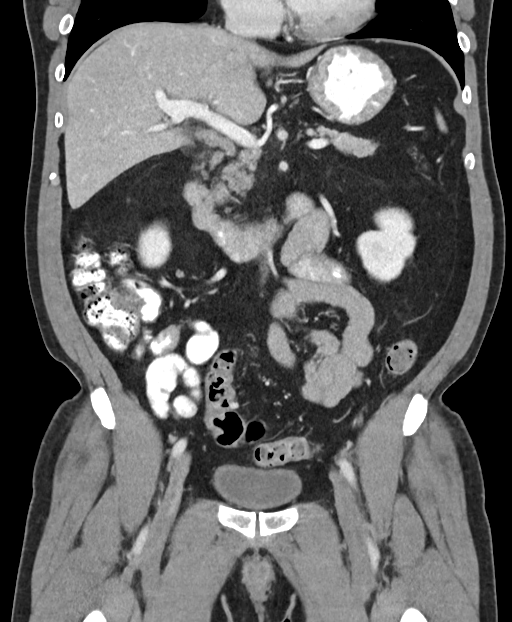
[im 54/97  soft-tissue]
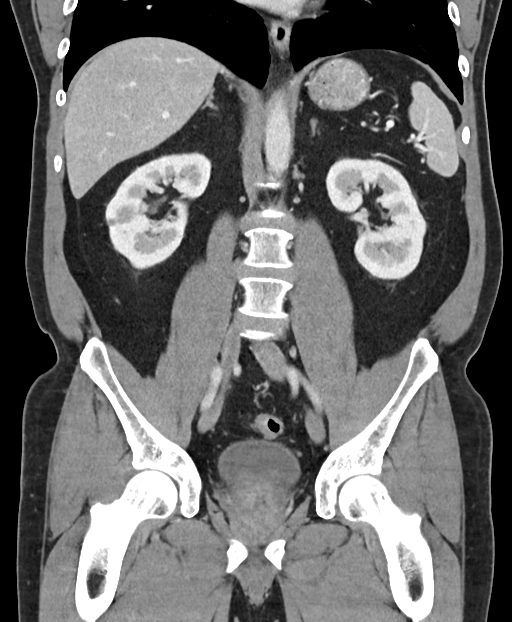

[12 of 46 positions shown; findings below may reference images not displayed]

Dose reduction technique used: Automated exposure control and adjustment of the mA and/or kV according to patient size. CT/Cardiac Nuclear Medicine exam count in previous 12-months: 0 

CONTRAST:

IV contrast:  100 mL Isovue 370

Oral contrast: 900 mL Gastrografin mixed with water. 

Serum creatinine: 0.9 mg/dL.
FINDINGS: LOWER CHEST:  

*
Incidental note of 2 mm left lower lobe nodule. Nonspecific

ABDOMEN AND PELVIS:

LIVER: 

*
Normal.

BILIARY TRACT: 

*
No intra- or extrahepatic biliary ductal dilation.

GALLBLADDER: 

*
No CT abnormalities.

PANCREAS: 

*
Masslike appearance in the tail of the pancreas measuring 17 x 20 x 14 mm versus normal parenchyma. 

*
No pancreatic ductal dilation.

*
No peripancreatic inflammatory changes.

SPLEEN: 

*
Normal.

ADRENALS: 

*
Normal.

KIDNEYS: 

*
No hydronephrosis or urolithiasis.

STOMACH AND BOWEL: 

*
Mild colonic diverticulosis without diverticulitis.

PERITONEUM:   

*
No free fluid or air.

MESENTERY: 

*
No significant abnormalities.

LYMPH NODES: 

*
No significantly enlarged abdominal lymph nodes.

VESSELS: 

*
Scattered atherosclerotic changes.

BLADDER/PELVIC ORGANS:

*
Urinary bladder is normal in appearance.

*
Mild prostamegaly measuring 38 x 49 x 42 mm, which corresponds to approximate volume of 41 mL, which is considered enlarged.

BONES AND SOFT TISSUES: 

*
No acute or suspicious osseous abnormalities. 

*
Small fat-containing left inguinal hernia.
IMPRESSION: 1.
20 mm masslike appearance of the pancreatic tail. Finding may be a normal variant or related to a very subtle pancreatic mass. MRI of the abdomen without and with contrast with MRCP protocol may be helpful for further characterization.

2.
No peripancreatic inflammatory changes or pancreatic ductal dilation.

Total radiation dose to patient is CTDIvol 32.57 mGy and DLP 1273.00 mGy-cm.

## 2021-10-15 IMAGING — MR MRCP (MR Cholangiogram w/Reconstructions)
10 of 12 series · 46 of 48 positions shown · IV contrast (prohance)
Comparison: Previous CT abdomen and pelvis 10/02/2021.

Images Obtained from [facility]
HISTORY: Pancreatic mass.
TECHNIQUE: Multiplanar, multisequence MRI of the abdomen without and with contrast with MRCP sequences was performed. Post-processing software generated rotating 3D MIP images.
Contrast dose: 20 mL ProHance

[Series 2: t2_haste_cor · coronal · 6.0mm · 1.56mm/px · 2 of 35 slices shown]
[im 1/35]
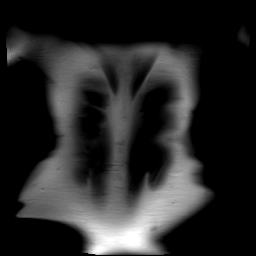
[im 35/35]
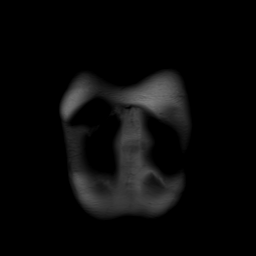

[Series 3: t2_(person_name)_(person_name)_(person_name) · axial · 5.0mm · 1.25mm/px · z∈[-111,+75]mm · 2 of 32 slices shown]
[im 1/32]
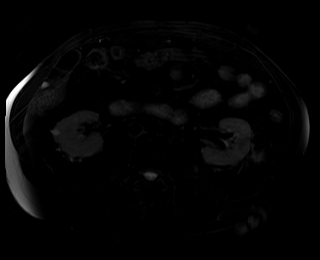
[im 32/32]
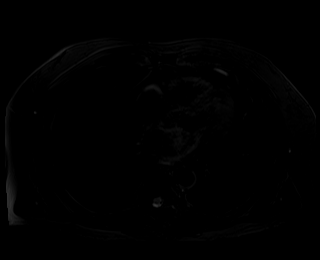

[Series 4: t2_haste_fs_cor_radial · sagittal · 50.0mm · 0.78mm/px · 1 of 6 slices shown]
[im 1/6]
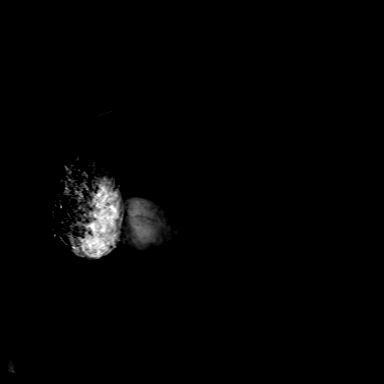

[Series 5: t2_space_cor_256_bh · coronal · 1.1mm · 1.19mm/px · 5 of 64 slices shown]
[im 1/64]
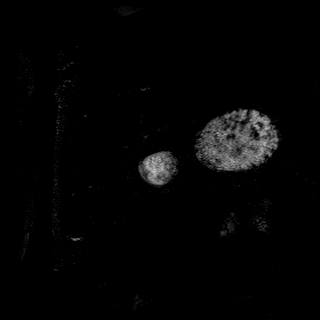
[im 16/64]
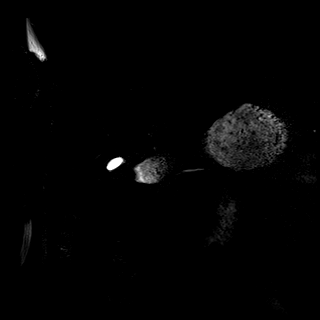
[im 32/64]
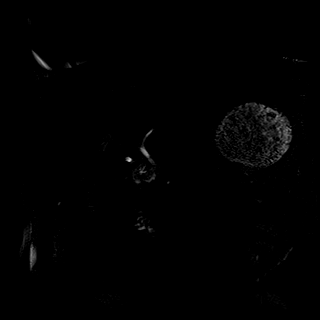
[im 48/64]
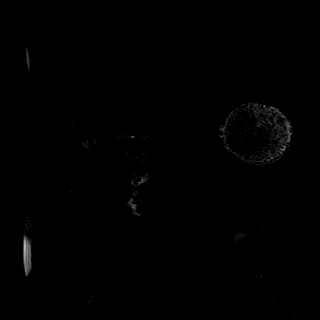
[im 64/64]
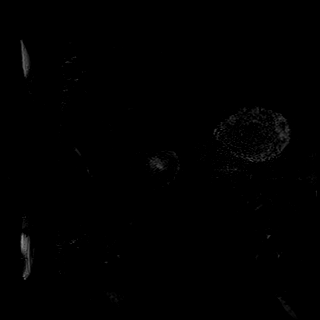

[Series 7: t1_vibe_(person_name)_pre_opp · axial · 3.0mm · 1.56mm/px · z∈[-125,+88]mm · 6 of 72 slices shown]
[im 1/72]
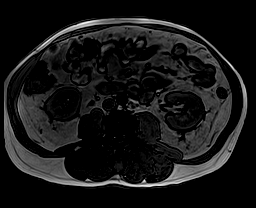
[im 15/72]
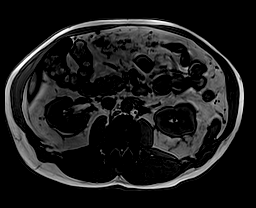
[im 29/72]
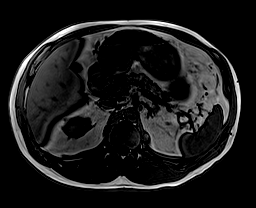
[im 43/72]
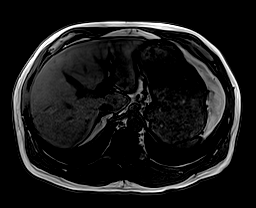
[im 57/72]
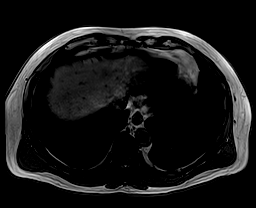
[im 72/72]
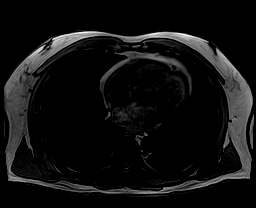

[Series 8: t1_vibe_(person_name)_pre_in · axial · 3.0mm · 1.56mm/px · z∈[-125,+88]mm · 6 of 72 slices shown]
[im 1/72]
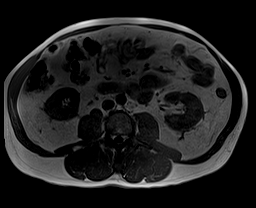
[im 15/72]
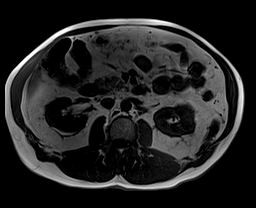
[im 29/72]
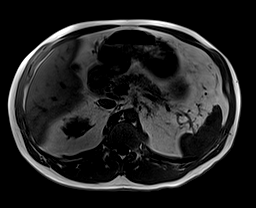
[im 43/72]
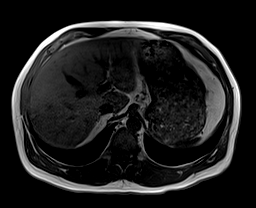
[im 57/72]
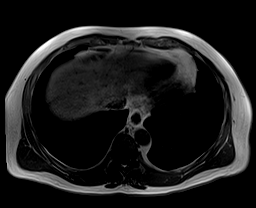
[im 72/72]
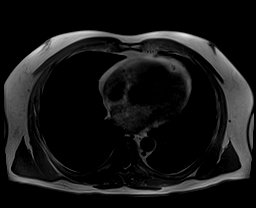

[Series 10: t1_vibe_(person_name)_pre_w · axial · 3.0mm · 1.56mm/px · z∈[-125,+88]mm · 6 of 72 slices shown]
[im 1/72]
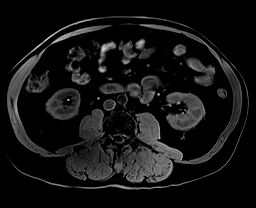
[im 15/72]
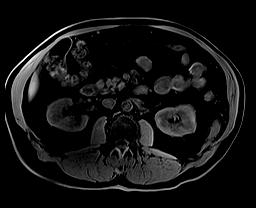
[im 29/72]
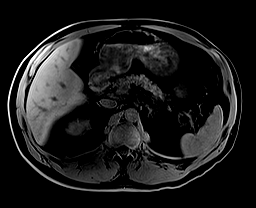
[im 43/72]
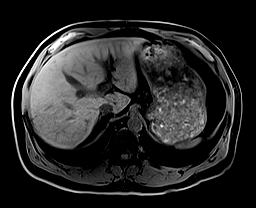
[im 57/72]
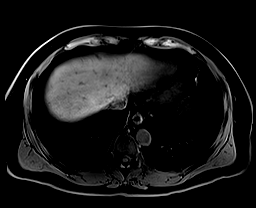
[im 72/72]
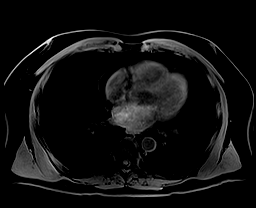

[Series 14: t1_vibe_(person_name)_(person_name)+(person_name)1_w · axial · 3.0mm · 1.56mm/px · z∈[-125,+88]mm · 6 of 72 slices shown]
[im 1/72]
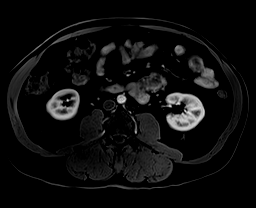
[im 15/72]
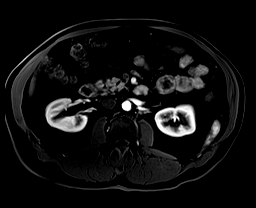
[im 29/72]
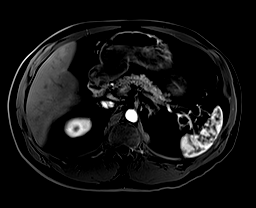
[im 43/72]
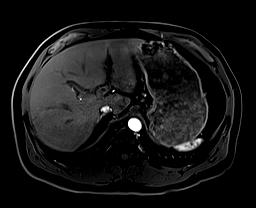
[im 57/72]
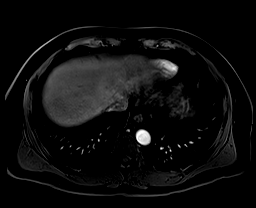
[im 72/72]
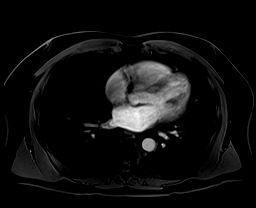

[Series 18: t1_vibe_(person_name)_(person_name)+(person_name)2_w · axial · 3.0mm · 1.56mm/px · z∈[-125,+88]mm · 6 of 72 slices shown]
[im 1/72]
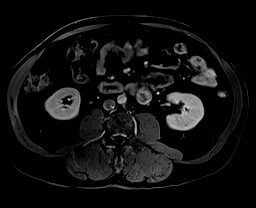
[im 15/72]
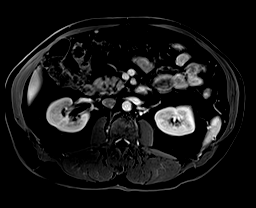
[im 29/72]
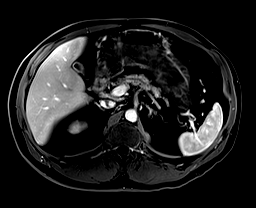
[im 43/72]
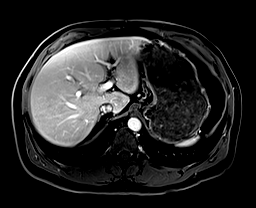
[im 57/72]
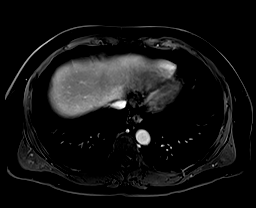
[im 72/72]
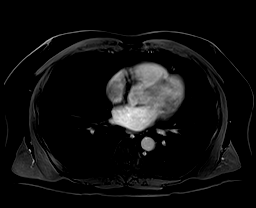

[Series 22: t1_vibe_(person_name)_(person_name)+(person_name)3_w · axial · 3.0mm · 1.56mm/px · z∈[-125,+88]mm · 6 of 72 slices shown]
[im 1/72]
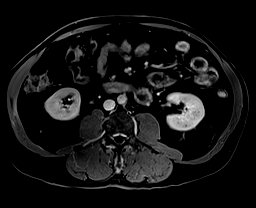
[im 15/72]
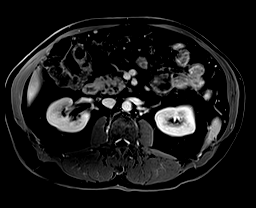
[im 29/72]
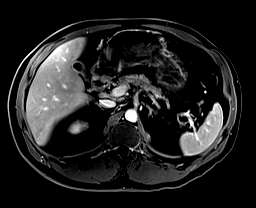
[im 43/72]
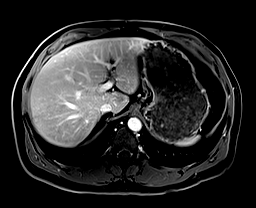
[im 57/72]
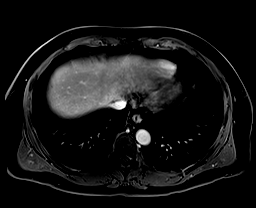
[im 72/72]
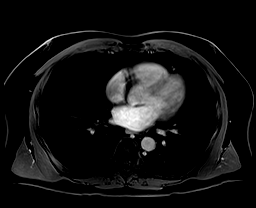

[46 of 48 positions shown; findings below may reference images not displayed]

FINDINGS: MRCP: The gallbladder is contracted. The common bile duct is normal in caliber. Pancreatic duct is normal in caliber with its expected insertion into the second portion of the duodenum.
MRI ABDOMEN: The pancreatic parenchyma appears homogeneous on all sequences. The tail of the pancreas is somewhat full, but otherwise unremarkable. This is felt to be an anatomic variant. No
enhancing pancreatic mass is seen. Again, there is no dilatation of the pancreatic duct.
The liver has a homogeneous signal without significant hepatic steatosis. The portal vein is patent.
The spleen, adrenal glands and kidneys are unremarkable with the exception of a small cyst in each kidney. The abdominal aorta has a normal caliber. No adenopathy is seen. Visualized bowel loops are
unremarkable. No worrisome marrow-replacing lesions are seen.
IMPRESSION: 1.  No pancreatic mass is seen. The tail of the pancreas is somewhat full, but otherwise unremarkable. This is felt to be an anatomic variant.
2.  Contracted gallbladder. No biliary duct dilatation is seen. No variant anatomy.
3.  Small bilateral renal cysts.
